# Patient Record
Sex: Male | Born: 1970 | Hispanic: No | Marital: Single | Smoking: Never smoker
Health system: Southern US, Community
[De-identification: ages and names within clinical notes are randomized; demographics above are authoritative.]

## PROBLEM LIST (undated history)

## (undated) DIAGNOSIS — E119 Type 2 diabetes mellitus without complications: Secondary | ICD-10-CM

## (undated) DIAGNOSIS — I10 Essential (primary) hypertension: Secondary | ICD-10-CM

## (undated) DIAGNOSIS — I429 Cardiomyopathy, unspecified: Secondary | ICD-10-CM

## (undated) HISTORY — PX: NO PAST SURGERIES: SHX2092

---

## 2018-09-01 ENCOUNTER — Encounter: Payer: Self-pay | Admitting: Emergency Medicine

## 2018-09-01 ENCOUNTER — Inpatient Hospital Stay: Payer: Self-pay

## 2018-09-01 ENCOUNTER — Emergency Department: Payer: Self-pay

## 2018-09-01 ENCOUNTER — Other Ambulatory Visit: Payer: Self-pay

## 2018-09-01 ENCOUNTER — Inpatient Hospital Stay
Admission: EM | Admit: 2018-09-01 | Discharge: 2018-09-02 | DRG: 305 | Disposition: A | Discharge status: Home / Self Care | Payer: Self-pay | Attending: Internal Medicine | Admitting: Internal Medicine

## 2018-09-01 DIAGNOSIS — R7989 Other specified abnormal findings of blood chemistry: Secondary | ICD-10-CM

## 2018-09-01 DIAGNOSIS — Z6838 Body mass index (BMI) 38.0-38.9, adult: Secondary | ICD-10-CM

## 2018-09-01 DIAGNOSIS — I5023 Acute on chronic systolic (congestive) heart failure: Secondary | ICD-10-CM | POA: Insufficient documentation

## 2018-09-01 DIAGNOSIS — Z8249 Family history of ischemic heart disease and other diseases of the circulatory system: Secondary | ICD-10-CM

## 2018-09-01 DIAGNOSIS — E785 Hyperlipidemia, unspecified: Secondary | ICD-10-CM | POA: Diagnosis present

## 2018-09-01 DIAGNOSIS — F1410 Cocaine abuse, uncomplicated: Secondary | ICD-10-CM | POA: Diagnosis present

## 2018-09-01 DIAGNOSIS — I43 Cardiomyopathy in diseases classified elsewhere: Secondary | ICD-10-CM | POA: Diagnosis present

## 2018-09-01 DIAGNOSIS — I119 Hypertensive heart disease without heart failure: Secondary | ICD-10-CM | POA: Diagnosis present

## 2018-09-01 DIAGNOSIS — R7301 Impaired fasting glucose: Secondary | ICD-10-CM | POA: Diagnosis present

## 2018-09-01 DIAGNOSIS — Z9114 Patient's other noncompliance with medication regimen: Secondary | ICD-10-CM

## 2018-09-01 DIAGNOSIS — I248 Other forms of acute ischemic heart disease: Secondary | ICD-10-CM | POA: Diagnosis present

## 2018-09-01 DIAGNOSIS — E669 Obesity, unspecified: Secondary | ICD-10-CM | POA: Diagnosis present

## 2018-09-01 DIAGNOSIS — E876 Hypokalemia: Secondary | ICD-10-CM | POA: Diagnosis present

## 2018-09-01 DIAGNOSIS — I160 Hypertensive urgency: Principal | ICD-10-CM | POA: Diagnosis present

## 2018-09-01 HISTORY — DX: Type 2 diabetes mellitus without complications: E11.9

## 2018-09-01 HISTORY — DX: Cardiomyopathy, unspecified: I42.9

## 2018-09-01 HISTORY — DX: Essential (primary) hypertension: I10

## 2018-09-01 LAB — URINALYSIS, COMPLETE (UACMP) WITH MICROSCOPIC
BACTERIA UA: NONE SEEN
Bilirubin Urine: NEGATIVE
Glucose, UA: NEGATIVE mg/dL
Ketones, ur: NEGATIVE mg/dL
Leukocytes, UA: NEGATIVE
Nitrite: NEGATIVE
Protein, ur: NEGATIVE mg/dL
Specific Gravity, Urine: 1.008 (ref 1.005–1.030)
Squamous Epithelial / HPF: NONE SEEN (ref 0–5)
pH: 6 (ref 5.0–8.0)

## 2018-09-01 LAB — URINE DRUG SCREEN, QUALITATIVE (ARMC ONLY)
AMPHETAMINES, UR SCREEN: NOT DETECTED
Barbiturates, Ur Screen: NOT DETECTED
Benzodiazepine, Ur Scrn: NOT DETECTED
COCAINE METABOLITE, UR ~~LOC~~: POSITIVE — AB
Cannabinoid 50 Ng, Ur ~~LOC~~: NOT DETECTED
MDMA (ECSTASY) UR SCREEN: NOT DETECTED
Methadone Scn, Ur: NOT DETECTED
Opiate, Ur Screen: NOT DETECTED
Phencyclidine (PCP) Ur S: NOT DETECTED
Tricyclic, Ur Screen: NOT DETECTED

## 2018-09-01 LAB — HEPATIC FUNCTION PANEL
ALT: 22 U/L (ref 0–44)
AST: 27 U/L (ref 15–41)
Albumin: 3.7 g/dL (ref 3.5–5.0)
Alkaline Phosphatase: 82 U/L (ref 38–126)
BILIRUBIN INDIRECT: 0.9 mg/dL (ref 0.3–0.9)
Bilirubin, Direct: 0.1 mg/dL (ref 0.0–0.2)
Total Bilirubin: 1 mg/dL (ref 0.3–1.2)
Total Protein: 7.7 g/dL (ref 6.5–8.1)

## 2018-09-01 LAB — TROPONIN I
Troponin I: 0.05 ng/mL (ref <0.03)
Troponin I: 0.05 ng/mL (ref <0.03)
Troponin I: 0.05 ng/mL (ref <0.03)

## 2018-09-01 LAB — CBC
HCT: 44.4 % (ref 39.0–52.0)
Hemoglobin: 14.3 g/dL (ref 13.0–17.0)
MCH: 27.9 pg (ref 26.0–34.0)
MCHC: 32.2 g/dL (ref 30.0–36.0)
MCV: 86.5 fL (ref 80.0–100.0)
PLATELETS: 363 10*3/uL (ref 150–400)
RBC: 5.13 MIL/uL (ref 4.22–5.81)
RDW: 13.1 % (ref 11.5–15.5)
WBC: 9.4 10*3/uL (ref 4.0–10.5)
nRBC: 0 % (ref 0.0–0.2)

## 2018-09-01 LAB — BASIC METABOLIC PANEL
Anion gap: 6 (ref 5–15)
BUN: 16 mg/dL (ref 6–20)
CO2: 27 mmol/L (ref 22–32)
Calcium: 8.5 mg/dL — ABNORMAL LOW (ref 8.9–10.3)
Chloride: 104 mmol/L (ref 98–111)
Creatinine, Ser: 1.11 mg/dL (ref 0.61–1.24)
GFR calc Af Amer: >60 mL/min (ref >60)
Glucose, Bld: 155 mg/dL — ABNORMAL HIGH (ref 70–99)
Potassium: 3.8 mmol/L (ref 3.5–5.1)
Sodium: 137 mmol/L (ref 135–145)

## 2018-09-01 LAB — MAGNESIUM: Magnesium: 2 mg/dL (ref 1.7–2.4)

## 2018-09-01 LAB — BRAIN NATRIURETIC PEPTIDE: B Natriuretic Peptide: 60 pg/mL (ref 0.0–100.0)

## 2018-09-01 MED ORDER — ONDANSETRON HCL 4 MG/2ML IJ SOLN: 4.0000 mg | Freq: Four times a day (QID) | INTRAMUSCULAR | Status: DC | PRN

## 2018-09-01 MED ORDER — ACETAMINOPHEN 650 MG RE SUPP: 650.0000 mg | Freq: Four times a day (QID) | RECTAL | Status: DC | PRN

## 2018-09-01 MED ORDER — ASPIRIN 81 MG PO CHEW
324.0000 mg | CHEWABLE_TABLET | Freq: Once | ORAL | Status: AC
  Administered 2018-09-01: 324 mg via ORAL
  Filled 2018-09-01: qty 4

## 2018-09-01 MED ORDER — ASPIRIN EC 81 MG PO TBEC
81.0000 mg | DELAYED_RELEASE_TABLET | Freq: Every day | ORAL | Status: DC
  Administered 2018-09-02: 81 mg via ORAL
  Filled 2018-09-01: qty 1

## 2018-09-01 MED ORDER — FUROSEMIDE 20 MG PO TABS
20.0000 mg | ORAL_TABLET | Freq: Every day | ORAL | Status: DC
  Administered 2018-09-02: 20 mg via ORAL
  Filled 2018-09-01: qty 1

## 2018-09-01 MED ORDER — INFLUENZA VAC SPLIT QUAD 0.5 ML IM SUSY: 0.5000 mL | PREFILLED_SYRINGE | INTRAMUSCULAR | Status: DC

## 2018-09-01 MED ORDER — CLONIDINE HCL 0.1 MG PO TABS
0.2000 mg | ORAL_TABLET | Freq: Once | ORAL | Status: AC
  Administered 2018-09-01: 0.2 mg via ORAL
  Filled 2018-09-01: qty 2

## 2018-09-01 MED ORDER — SODIUM CHLORIDE 0.9% FLUSH
3.0000 mL | Freq: Once | INTRAVENOUS | Status: AC
  Administered 2018-09-01: 3 mL via INTRAVENOUS

## 2018-09-01 MED ORDER — ONDANSETRON HCL 4 MG PO TABS: 4.0000 mg | ORAL_TABLET | Freq: Four times a day (QID) | ORAL | Status: DC | PRN

## 2018-09-01 MED ORDER — ENOXAPARIN SODIUM 40 MG/0.4ML ~~LOC~~ SOLN
40.0000 mg | SUBCUTANEOUS | Status: DC
  Administered 2018-09-01: 40 mg via SUBCUTANEOUS
  Filled 2018-09-01: qty 0.4

## 2018-09-01 MED ORDER — IOHEXOL 350 MG/ML SOLN
75.0000 mL | Freq: Once | INTRAVENOUS | Status: AC | PRN
  Administered 2018-09-01: 75 mL via INTRAVENOUS

## 2018-09-01 MED ORDER — HYDRALAZINE HCL 20 MG/ML IJ SOLN
5.0000 mg | INTRAMUSCULAR | Status: DC | PRN
  Administered 2018-09-02: 5 mg via INTRAVENOUS
  Filled 2018-09-01: qty 1

## 2018-09-01 MED ORDER — LISINOPRIL 20 MG PO TABS
20.0000 mg | ORAL_TABLET | Freq: Every day | ORAL | Status: DC
  Administered 2018-09-01 – 2018-09-02 (×2): 20 mg via ORAL
  Filled 2018-09-01: qty 2
  Filled 2018-09-01: qty 1

## 2018-09-01 MED ORDER — CARVEDILOL 6.25 MG PO TABS
6.2500 mg | ORAL_TABLET | Freq: Two times a day (BID) | ORAL | Status: DC
  Administered 2018-09-02: 6.25 mg via ORAL
  Filled 2018-09-01: qty 1

## 2018-09-01 MED ORDER — SPIRONOLACTONE 25 MG PO TABS
12.5000 mg | ORAL_TABLET | Freq: Every day | ORAL | Status: DC
  Administered 2018-09-01 – 2018-09-02 (×2): 12.5 mg via ORAL
  Filled 2018-09-01 (×2): qty 0.5
  Filled 2018-09-01: qty 1

## 2018-09-01 MED ORDER — FUROSEMIDE 10 MG/ML IJ SOLN
60.0000 mg | Freq: Once | INTRAMUSCULAR | Status: AC
  Administered 2018-09-01: 60 mg via INTRAVENOUS
  Filled 2018-09-01: qty 8

## 2018-09-01 MED ORDER — ACETAMINOPHEN 325 MG PO TABS: 650.0000 mg | ORAL_TABLET | Freq: Four times a day (QID) | ORAL | Status: DC | PRN

## 2018-09-01 MED ORDER — METOPROLOL SUCCINATE ER 50 MG PO TB24
25.0000 mg | ORAL_TABLET | Freq: Every day | ORAL | Status: DC
  Administered 2018-09-01: 25 mg via ORAL
  Filled 2018-09-01: qty 1

## 2018-09-01 NOTE — ED Notes (Signed)
Pt given meal tray.

## 2018-09-01 NOTE — ED Notes (Signed)
Report given to Cole RN 

## 2018-09-01 NOTE — ED Triage Notes (Addendum)
C/O dizziness x 1 day.   States symptoms started yesterday evening at around 1730.  Also C/O chest pain and blurry vision.  Also c/o cough x 2-3 days.  Patient states he has a history of HTN and has been off medication for about 2 months.  States he does not known the names of his medications.

## 2018-09-01 NOTE — H&P (Signed)
[facility] [place] [facility] [place]   PATIENT NAME: Tom Holmes    MR#:  [id number]  DATE OF BIRTH:  [birth year]  DATE OF ADMISSION:  09/01/2018  PRIMARY CARE PHYSICIAN: Patient, No Pcp Per   REQUESTING/REFERRING PHYSICIAN: Dr Willy Eddy  CHIEF COMPLAINT:  No chief complaint on file.   HISTORY OF PRESENT ILLNESS:  Tom Holmes  is a 48 y.o. male with a known history of hypertension.  He has not been on medications in 2 years.  He does not have a medical doctor.  He was told in the past that he needed a defibrillator and was prescribed a LifeVest but could not afford the LifeVest.  He has not followed up since then.  He states that he developed blurry vision and dizziness starting yesterday evening.  He has a weird feeling in his chest.  Has constant pressure there 6 out of 10 intensity in the center of his chest.  Did have some nausea vomiting last night.  Sitting up helps.  Did have some sweating this morning.  PAST MEDICAL HISTORY:   Past Medical History:  Diagnosis Date  . Cardiomyopathy (HCC)   . Hypertension     PAST SURGICAL HISTORY:   Past Surgical History:  Procedure Laterality Date  . NO PAST SURGERIES      SOCIAL HISTORY:   Social History   Tobacco Use  . Smoking status: Never Smoker  . Smokeless tobacco: Never Used  Substance Use Topics  . Alcohol use: Yes    Comment: socially    FAMILY HISTORY:   Family History  Problem Relation Age of Onset  . Hypertension Mother     DRUG ALLERGIES:  No Known Allergies  REVIEW OF SYSTEMS:  CONSTITUTIONAL: No fever, fatigue or weakness.  Positive for sweating this morning. EYES: Positive for blurry vision. EARS, NOSE, AND THROAT: No tinnitus or ear pain. No sore throat RESPIRATORY: Some cough, shortness of breath, wheezing.  No hemoptysis.  CARDIOVASCULAR: Positive for chest pain, no orthopnea, edema.  GASTROINTESTINAL: Last night with nausea, vomiting.  No diarrhea or  abdominal pain. No blood in bowel movements.  Some abdominal bloating over the last 2 months. GENITOURINARY: No dysuria, hematuria.  ENDOCRINE: No polyuria, nocturia,  HEMATOLOGY: No anemia, easy bruising or bleeding SKIN: No rash or lesion. MUSCULOSKELETAL: No joint pain or arthritis.   NEUROLOGIC: No tingling, numbness, weakness.  PSYCHIATRY: No anxiety or depression.   MEDICATIONS AT HOME:   Prior to Admission medications   Not on File   Patient takes no medications.  VITAL SIGNS:  Blood pressure (!) 188/124, pulse 84, temperature 98.4 F (36.9 C), temperature source Oral, resp. rate 18, height 5\' 6"  (1.676 m), weight 108.9 kg, SpO2 97 %.  PHYSICAL EXAMINATION:  GENERAL:  48 y.o.-year-old patient lying in the bed with no acute distress.  EYES: Pupils equal, round, reactive to light and accommodation. No scleral icterus. Extraocular muscles intact.  HEENT: Head atraumatic, normocephalic. Oropharynx and nasopharynx clear.  NECK:  Supple, no jugular venous distention. No thyroid enlargement, no tenderness.  LUNGS: Normal breath sounds bilaterally, no wheezing, rales,rhonchi or crepitation. No use of accessory muscles of respiration.  CARDIOVASCULAR: S1, S2 normal. No murmurs, rubs, or gallops.  ABDOMEN: Soft, nontender, nondistended. Bowel sounds present. No organomegaly or mass.  EXTREMITIES: No pedal edema, cyanosis, or clubbing.  NEUROLOGIC: Cranial nerves II through XII are intact. Muscle strength 5/5 in all extremities. Sensation intact. Gait not checked.  PSYCHIATRIC: The patient is alert and  oriented x 3.  SKIN: No rash, lesion, or ulcer.   LABORATORY PANEL:   CBC Recent Labs  Lab 09/01/18 0733  WBC 9.4  HGB 14.3  HCT 44.4  PLT 363   ------------------------------------------------------------------------------------------------------------------  Chemistries  Recent Labs  Lab 09/01/18 0733  NA 137  K 3.8  CL 104  CO2 27  GLUCOSE 155*  BUN 16  CREATININE  1.11  CALCIUM 8.5*  AST 27  ALT 22  ALKPHOS 82  BILITOT 1.0   ------------------------------------------------------------------------------------------------------------------  Cardiac Enzymes Recent Labs  Lab 09/01/18 0733  TROPONINI 0.05*   ------------------------------------------------------------------------------------------------------------------  RADIOLOGY:  Dg Chest 2 View  Result Date: 09/01/2018 CLINICAL DATA:  dizziness x 1 day. States symptoms started yesterday evening at around 1730. Also C/O chest pain and blurry vision. Also c/o cough x 2-3 days. Patient states he has a history of HTN and has been off medication for about 2 months. Hx - HTN, non-smoker. EXAM: CHEST - 2 VIEW COMPARISON:  none FINDINGS: Lungs are clear. Heart size and mediastinal contours are within normal limits. No effusion. Visualized bones unremarkable. IMPRESSION: No acute cardiopulmonary disease. Electronically Signed   By: Corlis Leak M.D.   On: 09/01/2018 08:14    EKG:   Normal sinus rhythm 94 bpm, flipped T wave in V6 and lead I.  Left atrial enlargement.  IMPRESSION AND PLAN:   1.  Hypertensive urgency.  The patient has been off hypertensive medications for the past 2 years so his blood pressure likely has been high for a long period of time.  I would not like to lower his blood pressure too low too quickly.  Since the patient does have a history of cardiomyopathy I will start on low-dose lisinopril spironolactone and Toprol and PRN IV hydralazine. 2.  Chest pain and borderline troponin, history of cardiomyopathy CT Angio of the chest to rule out dissection.  Cardiology consultation.  Serial troponins to rule out heart disease.  Started on aspirin and Toprol.  Echocardiogram with his history of cardiomyopathy.  Troponin may be demand ischemia from hypertensive urgency. 3.  Impaired fasting glucose send off a hemoglobin A1c 4.  Obesity with a BMI of 38.74.   All the records are reviewed and  case discussed with ED provider. Management plans discussed with the patient, family and they are in agreement.  CODE STATUS: Full code  TOTAL TIME TAKING CARE OF THIS PATIENT: 55 minutes.    Alford Highland M.D on 09/01/2018 at 9:33 AM  Between 7am to 6pm - Pager - [phone]  After 6pm call admission pager [phone]  [facility]  [phone]  CC: Primary care physician; Patient, No Pcp Per

## 2018-09-01 NOTE — ED Provider Notes (Signed)
[facility] Emergency Department Provider Note    First MD Initiated Contact with Patient 09/01/18 [phone]     (approximate)  I have reviewed the triage vital signs and the nursing notes.   HISTORY  Chief Complaint SOB, blurry vision   HPI Tom Holmes is a 48 y.o. male a history of hypertension as well as congestive heart failure recently moving up to [facility] from [facility] presents to the ER for evaluation of shortness of breath, orthopnea as well as blurred vision that started yesterday.  States his orthopnea and shortness of breath is been worsening over the past several days.  Has not been on his home antihypertensive medications or blood pressure medications for over 2 months.  Denies any chest pain.  States he has had similar episodes in the past when his blood pressure was elevated.  Denies any recent fevers or cough.  Is having worsening exertional dyspnea and has noticed worsening swelling in his legs.    Echo 2018:   Summary  The left ventricular systolic function is mildly decreased with estimated  LV ejection fraction of 45-50%  Past Medical History:  Diagnosis Date  . Cardiomyopathy (HCC)   . Diabetes mellitus without complication (HCC)   . Hypertension    Family History  Problem Relation Age of Onset  . Hypertension Mother    Past Surgical History:  Procedure Laterality Date  . NO PAST SURGERIES     Patient Active Problem List   Diagnosis Date Noted  . Hypertensive urgency 09/01/2018      Prior to Admission medications   Not on File    Allergies Patient has no known allergies.    Social History Social History   Tobacco Use  . Smoking status: Never Smoker  . Smokeless tobacco: Never Used  Substance Use Topics  . Alcohol use: Yes    Comment: socially  . Drug use: Never    Review of Systems Patient denies headaches, rhinorrhea, blurry vision, numbness, shortness of breath, chest pain, edema, cough,  abdominal pain, nausea, vomiting, diarrhea, dysuria, fevers, rashes or hallucinations unless otherwise stated above in HPI. ____________________________________________   PHYSICAL EXAM:  VITAL SIGNS: Vitals:   09/01/18 1013 09/01/18 1039  BP: (!) 166/112 (!) 156/101  Pulse: 75 75  Resp: 18 18  Temp:    SpO2: 97% 97%    Constitutional: Alert and oriented.  Eyes: Conjunctivae are normal.  Head: Atraumatic. Nose: No congestion/rhinnorhea. Mouth/Throat: Mucous membranes are moist.   Neck: No stridor. Painless ROM.  Cardiovascular: Normal rate, regular rhythm. Grossly normal heart sounds.  Good peripheral circulation. Respiratory: Normal respiratory effort.  No retractions. Lungs with bibasilar crackles Gastrointestinal: Soft and nontender. No distention. No abdominal bruits. No CVA tenderness. Genitourinary:  Musculoskeletal: No lower extremity tenderness nor edema.  No joint effusions. Neurologic:  Normal speech and language. No gross focal neurologic deficits are appreciated. No facial droop Skin:  Skin is warm, dry and intact. No rash noted. Psychiatric: Mood and affect are normal. Speech and behavior are normal.  ____________________________________________   LABS (all labs ordered are listed, but only abnormal results are displayed)  Results for orders placed or performed during the hospital encounter of 09/01/18 (from the past 24 hour(s))  Basic metabolic panel     Status: Abnormal   Collection Time: 09/01/18  7:33 AM  Result Value Ref Range   Sodium 137 135 - 145 mmol/L   Potassium 3.8 3.5 - 5.1 mmol/L   Chloride 104 98 -  111 mmol/L   CO2 27 22 - 32 mmol/L   Glucose, Bld 155 (H) 70 - 99 mg/dL   BUN 16 6 - 20 mg/dL   Creatinine, Ser 9.601.11 0.61 - 1.24 mg/dL   Calcium 8.5 (L) 8.9 - 10.3 mg/dL   GFR calc non Af Amer >60 >60 mL/min   GFR calc Af Amer >60 >60 mL/min   Anion gap 6 5 - 15  CBC     Status: None   Collection Time: 09/01/18  7:33 AM  Result Value Ref Range    WBC 9.4 4.0 - 10.5 K/uL   RBC 5.13 4.22 - 5.81 MIL/uL   Hemoglobin 14.3 13.0 - 17.0 g/dL   HCT 45.444.4 09.839.0 - 11.952.0 %   MCV 86.5 80.0 - 100.0 fL   MCH 27.9 26.0 - 34.0 pg   MCHC 32.2 30.0 - 36.0 g/dL   RDW 14.713.1 82.911.5 - 56.215.5 %   Platelets 363 150 - 400 K/uL   nRBC 0.0 0.0 - 0.2 %  Troponin I - ONCE - STAT     Status: Abnormal   Collection Time: 09/01/18  7:33 AM  Result Value Ref Range   Troponin I 0.05 (HH) <0.03 ng/mL  Hepatic function panel     Status: None   Collection Time: 09/01/18  7:33 AM  Result Value Ref Range   Total Protein 7.7 6.5 - 8.1 g/dL   Albumin 3.7 3.5 - 5.0 g/dL   AST 27 15 - 41 U/L   ALT 22 0 - 44 U/L   Alkaline Phosphatase 82 38 - 126 U/L   Total Bilirubin 1.0 0.3 - 1.2 mg/dL   Bilirubin, Direct 0.1 0.0 - 0.2 mg/dL   Indirect Bilirubin 0.9 0.3 - 0.9 mg/dL  Brain natriuretic peptide     Status: None   Collection Time: 09/01/18  7:33 AM  Result Value Ref Range   B Natriuretic Peptide 60.0 0.0 - 100.0 pg/mL  Urinalysis, Complete w Microscopic     Status: Abnormal   Collection Time: 09/01/18 10:15 AM  Result Value Ref Range   Color, Urine STRAW (A) YELLOW   APPearance CLEAR (A) CLEAR   Specific Gravity, Urine 1.008 1.005 - 1.030   pH 6.0 5.0 - 8.0   Glucose, UA NEGATIVE NEGATIVE mg/dL   Hgb urine dipstick SMALL (A) NEGATIVE   Bilirubin Urine NEGATIVE NEGATIVE   Ketones, ur NEGATIVE NEGATIVE mg/dL   Protein, ur NEGATIVE NEGATIVE mg/dL   Nitrite NEGATIVE NEGATIVE   Leukocytes, UA NEGATIVE NEGATIVE   RBC / HPF 0-5 0 - 5 RBC/hpf   WBC, UA 0-5 0 - 5 WBC/hpf   Bacteria, UA NONE SEEN NONE SEEN   Squamous Epithelial / LPF NONE SEEN 0 - 5   Mucus PRESENT    ____________________________________________  EKG My review and personal interpretation at Time: 7:42   Indication: htn  Rate: 95  Rhythm: sinus Axis: normal Other: anterior t wave inversion, no stemi criteria ____________________________________________  RADIOLOGY  I personally reviewed all  radiographic images ordered to evaluate for the above acute complaints and reviewed radiology reports and findings.  These findings were personally discussed with the patient.  Please see medical record for radiology report.  ____________________________________________   PROCEDURES  Procedure(s) performed:  Procedures    Critical Care performed: no ____________________________________________   INITIAL IMPRESSION / ASSESSMENT AND PLAN / ED COURSE  Pertinent labs & imaging results that were available during my care of the patient were reviewed by me and considered  in my medical decision making (see chart for details).   DDX: Hypertensive urgency, congestive heart failure, volume overload, electrolyte abnormality, anemia, medication noncompliance, ACS  Daelon Viano is a 48 y.o. who presents to the ED with was as described above.  No focal neuro deficits but does describe blurry vision certainly worrisome for hypertensive urgency.  We will give clonidine.  Describing orthopnea and lower extremity swelling given his history of congestive heart failure will give Lasix.  The patient will be placed on continuous pulse oximetry and telemetry for monitoring.  Laboratory evaluation will be sent to evaluate for the above complaints.     Clinical Course as of Sep 01 1044  Mon Sep 01, 2018  [id number] Patient's troponin is elevated.  In the setting of his significant hypertension particular is elevated diastolic pressures with symptomatic hypertension with no PCP I do believe the patient would benefit from medical management in the hospital, possible cardiology consultation.  He is given aspirin.  He denies any pain right now.  Blood pressure is coming down with clonidine as well as Lasix.  Not floridly volume overloaded to suggest pulmonary edema but was previously on Lasix and given the lower extremity swelling and orthopnea I think he would benefit from this.  Will consult with hospitalist.   [PR]      Clinical Course User Index [PR] Willy Eddy, MD     As part of my medical decision making, I reviewed the following data within the electronic MEDICAL RECORD NUMBER Nursing notes reviewed and incorporated, Labs reviewed, notes from prior ED visits and [place] Controlled Substance Database   ____________________________________________   FINAL CLINICAL IMPRESSION(S) / ED DIAGNOSES  Final diagnoses:  Hypertensive urgency      NEW MEDICATIONS STARTED DURING THIS VISIT:  New Prescriptions   No medications on file     Note:  This document was prepared using Dragon voice recognition software and may include unintentional dictation errors.    Willy Eddy, MD 09/01/18 1046

## 2018-09-01 NOTE — Consult Note (Signed)
Cardiology Consultation:   Patient ID: Tom Rammingony Jicha MRN: [id number]; DOB: 1970/11/10  Admit date: 09/01/2018 Date of Consult: 09/01/2018  Primary Care Provider: Patient, No Pcp Per Primary Cardiologist: [facility], Dr. Kirke CorinArida  Primary Electrophysiologist:  None    Patient Profile:   Tom Holmes is a 48 y.o. male with a hx of HTN, HFrEF (EF 20-25%  45-50%), diabetes mellitus with neuropathy, hypertension, obesity, CKD 3, substance abuse with past history of cocaine and alcohol abuse, and medication non-compliance who is being seen today for the evaluation of hypertensive urgency and elevated troponin at the request of Dr. Renae GlossWieting.  History of Present Illness:   Mr. Trudee GripOxendine is a 48 yo male with PMH as above.  [facility] was [facility] 2017 for hypertensive urgency and acute on chronic HFrEF per review of care everywhere. 2017 echo showed EF 20 to 25% with mild to moderate concentric hypertrophy with severe diffuse hypokinesis with mild mitral regurgitation and right ventricular systolic pressure estimated at 49 mmHg.  He was reportedly advised to get a LifeVest as he could not afford cardiac catheterization.  He was started on medical management for HFrEF; however, it was reported that he was not taking medication, because he felt weak with the medications.  Patient was seen at [facility] in August 2018 when admitted for worsening shortness of breath.  At that time, patient reported his last cocaine abuse was the previous week.  He reported progressive shortness of breath over the last 24 hours, recent cocaine use, and a diet high in salt.  Specifically, he had salty chips prior to admission.  He also complained of orthopnea.  He denied chest pain, lower extremity edema, abdominal pain, leg pain, or recent travel.  At presentation, he was unable to speak in full sentences due to his shortness of breath.  He was given a dose of Lasix in the ED with alleviation  of his symptoms.  He was also restarted on a beta-blocker, ACE inhibitor, and diuretics with subsequent improvement in his blood pressure.  Prazosin was added on the third day for further blood pressure management.  Repeat 2018 echocardiogram revealed improvement in his ejection fraction to EF 40 to 45%.  He was discharged home 03/28/2017 and prescription sent electronically to his pharmacy.  Since that time, the patient reports he has experienced periodic shortness of breath.  He has not taken his medications for the past two years, as he was attempting to control his blood pressure through diet and exercise. When questioned further about stopping his medications, he also reported financial difficulties and difficulty with insurance.  He denied having a past or current cardiologist. He does not have a regular PCP. He denied an exercise routine or any dietary changes.  He eats once daily, and this meal typically includes macaroni and cheese, spaghetti, and other prepackaged foods. When asked if he checked his blood pressure at home, he reported that he does, but that he usually runs "45/45." He reported chronic orthopnea, stating he usually needed 1 pillow and another folded in half to sleep at night. He also reported progressive shortness of breath with even mild activity, such as walking a block or going upstairs. He typically drank 1-2 glasses of tea and 1-2 glasses of water daily. He denied a history of smoking but did report a history of both drug and alcohol use with tox screen pending. He denied a history of LEE but did report abdominal swelling.  The day  before his admission, he was on his way back from being out of town.  He was "just sitting," when he started to feel short of breath / DOE and dizzy with associated chest tightness, diaphoresis, blurry vision, constipation, nausea, and emesis x3. He did not look at this emesis to describe it further. He reported 6/10 chest tightness "like a band across  his chest," which he stated was still bothering him at the time of the interview. He has had pain like this in the past with elevated BP. He also reported current abdominal swelling. He stated his vision had improved but was still somewhat blurry.  He denied headache, palpitations, or feeling of racing HR. He reported symptoms of pre-syncope, made worse when attempting to stand. He reported all of these symptoms started yesterday and lasted throughout the night, interfering with his sleep.  No further episodes of emesis were reported. Today, he decided to report to the emergency room.    In the ED, he was hypertensive with BP 166/112, HR 75, 97% ORA. He received clonidine and lasix, given there was a concern for hypertensive urgency and he was thought to be volume overloaded. EKG showed SR, 94bpm, IVCD, LVH, probable LAE, TWI lateral leads.  CT showed no evidence of aneurysm or dissection, no acute cardiopulmonary disease, and mild hepatic steatosis. Troponin mildly elevated, flat trending at 0.05  0.05. Cardiology consulted for further evaluation.   Past Medical History:  Diagnosis Date  . Cardiomyopathy (HCC)   . Diabetes mellitus without complication (HCC)   . Hypertension     Past Surgical History:  Procedure Laterality Date  . NO PAST SURGERIES       Home Medications:  Prior to Admission medications   Not on File    Inpatient Medications: Scheduled Meds: . [START ON 09/02/2018] aspirin EC  81 mg Oral Daily  . enoxaparin (LOVENOX) injection  40 mg Subcutaneous Q24H  . [START ON 09/02/2018] furosemide  20 mg Oral Daily  . lisinopril  20 mg Oral Daily  . metoprolol succinate  25 mg Oral Daily  . spironolactone  12.5 mg Oral Daily   Continuous Infusions:  PRN Meds: acetaminophen **OR** acetaminophen, hydrALAZINE, ondansetron **OR** ondansetron (ZOFRAN) IV  Allergies:   No Known Allergies  Social History:   Social History   Socioeconomic History  . Marital status: Single     Spouse name: Not on file  . Number of children: Not on file  . Years of education: Not on file  . Highest education level: Not on file  Occupational History  . Not on file  Social Needs  . Financial resource strain: Not on file  . Food insecurity:    Worry: Not on file    Inability: Not on file  . Transportation needs:    Medical: Not on file    Non-medical: Not on file  Tobacco Use  . Smoking status: Never Smoker  . Smokeless tobacco: Never Used  Substance and Sexual Activity  . Alcohol use: Yes    Comment: socially  . Drug use: Never  . Sexual activity: Not on file  Lifestyle  . Physical activity:    Days per week: Not on file    Minutes per session: Not on file  . Stress: Not on file  Relationships  . Social connections:    Talks on phone: Not on file    Gets together: Not on file    Attends religious service: Not on file    Active member  of club or organization: Not on file    Attends meetings of clubs or organizations: Not on file    Relationship status: Not on file  . Intimate partner violence:    Fear of current or ex partner: Not on file    Emotionally abused: Not on file    Physically abused: Not on file    Forced sexual activity: Not on file  Other Topics Concern  . Not on file  Social History Narrative  . Not on file    Family History:    Family History  Problem Relation Age of Onset  . Hypertension Mother      ROS:  Please see the history of present illness.  Review of Systems  Constitutional: Positive for diaphoresis. Negative for weight loss.  Eyes: Positive for blurred vision.  Respiratory: Positive for shortness of breath. Negative for cough and hemoptysis.   Cardiovascular: Positive for chest pain and orthopnea. Negative for palpitations, claudication, leg swelling and PND.       Abdominal swelling  Gastrointestinal: Positive for constipation, nausea and vomiting. Negative for diarrhea.  Genitourinary: Negative for dysuria, frequency,  hematuria and urgency.  Musculoskeletal: Negative for back pain.  Neurological: Positive for dizziness. Negative for focal weakness, loss of consciousness and headaches.  All other systems reviewed and are negative.   All other ROS reviewed and negative.     Physical Exam/Data:   Vitals:   09/01/18 1039 09/01/18 1100 09/01/18 1205 09/01/18 1206  BP: (!) 156/101 (!) 148/110    Pulse: 75 84 90 100  Resp: 18     Temp:      TempSrc:      SpO2: 97% 97% 98% 98%  Weight:      Height:        Intake/Output Summary (Last 24 hours) at 09/01/2018 1251 Last data filed at 09/01/2018 1000 Gross per 24 hour  Intake 240 ml  Output 800 ml  Net -560 ml   Filed Weights   09/01/18 0729  Weight: 108.9 kg   Body mass index is 38.74 kg/m.  General:  Obese, in no acute distress other than light sensitivity HEENT: normal Neck: no JVD Vascular: No carotid bruits; radial pulses 2+ right, 2+ left  Cardiac:  normal S1, S2; RRR; no murmur Lungs:  clear to auscultation bilaterally, no wheezing, rhonchi or rales  Abd: firm, tight, nontender, no hepatomegaly  Ext: no bilateral lower extremity edema Musculoskeletal:  No deformities, BUE and BLE strength normal and equal Skin: warm and dry  Neuro:  no focal abnormalities noted, vision sensitivity Psych:  Normal affect   EKG:  The EKG was personally reviewed and demonstrates:  As in HPI. SR, 94bpm LVH, probably LAE, IVCD, TWI lateral leads Telemetry:  Telemetry was personally reviewed and demonstrates:  HR 70-90s  Relevant CV Studies: Pending updated echo  03/26/2017 TTE Findings   Left Ventricle  Left ventricle size is normal.  Mildly increased left ventricle wall thickness.  The left ventricular systolic function is mildly decreased with estimated  LV ejection fraction of 45-50%.  There is mild global hypokinesis of the LV wall.  Left Atrium  Mildly dilated left atrium.  No evidence of thrombus within left atrium.  No evidence of atrial  septal defect.  No evidence of intracardiac shunting by color flow Doppler.  Right Atrium  Mildly dilated right atrium.  No evidence of thrombus or mass in the right atrium.  Right Ventricle  Mildly enlarged right ventricle cavity.  No evidence  of any mass in the right ventricle.  Mitral Valve  The mitral valve leaflets are mildly thickened.  Mild Mitral Annular Calcification  No evidence of mitral valve stenosis.  Trace mitral regurgitation is present.  Tricuspid Valve  The tricuspid valve is normal in structure and function.  No evidence of tricuspid stenosis.  No evidence of tricuspid regurgitation.  Aortic Valve  Aortic valve leaflets are somewhat thickened.  The aortic valve is trileaflet.  No hemodynamically significant valvular aortic stenosis.  No evidence of aortic valve regurgitation.  Pulmonic Valve  Thickened pulmonic valve leaflets with good excursion.  Trace pulmonic regurgitation present.  Miscellaneous  The aorta is within normal limits.  Aortic root dimension within normal limits.  Aortic root is somewhat fibrocalcified.  The Pulmonary artery is within normal limits.  The IVC is normal.  Pericardial Effusion  There is no pericardial effusion.  Laboratory Data:  Chemistry Recent Labs  Lab 09/01/18 0733  NA 137  K 3.8  CL 104  CO2 27  GLUCOSE 155*  BUN 16  CREATININE 1.11  CALCIUM 8.5*  GFRNONAA >60  GFRAA >60  ANIONGAP 6    Recent Labs  Lab 09/01/18 0733  PROT 7.7  ALBUMIN 3.7  AST 27  ALT 22  ALKPHOS 82  BILITOT 1.0   Hematology Recent Labs  Lab 09/01/18 0733  WBC 9.4  RBC 5.13  HGB 14.3  HCT 44.4  MCV 86.5  MCH 27.9  MCHC 32.2  RDW 13.1  PLT 363   Cardiac Enzymes Recent Labs  Lab 09/01/18 0733  TROPONINI 0.05*   No results for input(s): TROPIPOC in the last 168 hours.  BNP Recent Labs  Lab 09/01/18 0733  BNP 60.0    DDimer No results for input(s): DDIMER in the last 168 hours.  Radiology/Studies:  Dg Chest 2  View  Result Date: 09/01/2018 CLINICAL DATA:  dizziness x 1 day. States symptoms started yesterday evening at around 1730. Also C/O chest pain and blurry vision. Also c/o cough x 2-3 days. Patient states he has a history of HTN and has been off medication for about 2 months. Hx - HTN, non-smoker. EXAM: CHEST - 2 VIEW COMPARISON:  none FINDINGS: Lungs are clear. Heart size and mediastinal contours are within normal limits. No effusion. Visualized bones unremarkable. IMPRESSION: No acute cardiopulmonary disease. Electronically Signed   By: Corlis Leak M.D.   On: 09/01/2018 08:14   Ct Angio Chest/abd/pel For Dissection W And/or W/wo  Result Date: 09/01/2018 CLINICAL DATA:  Hypertension, blurred vision, dizziness, possible chest palpitations EXAM: CT ANGIOGRAPHY CHEST, ABDOMEN AND PELVIS TECHNIQUE: Multidetector CT imaging through the chest, abdomen and pelvis was performed using the standard protocol during bolus administration of intravenous contrast. Multiplanar reconstructed images and MIPs were obtained and reviewed to evaluate the vascular anatomy. CONTRAST:  75mL OMNIPAQUE IOHEXOL 350 MG/ML SOLN COMPARISON:  Chest radiographs dated 09/01/2018 FINDINGS: CTA CHEST FINDINGS Cardiovascular: On unenhanced CT, there is no evidence of intramural hematoma. No evidence of thoracic aortic aneurysm or dissection. Although not tailored for evaluation of the pulmonary arteries, there is no evidence of central pulmonary embolism. The heart is normal in size.  No pericardial effusion. Mild coronary atherosclerosis of the LAD. Mediastinum/Nodes: No suspicious mediastinal lymphadenopathy. Visualized thyroid is grossly unremarkable. Lungs/Pleura: Lungs are clear. No suspicious pulmonary nodules. No focal consolidation. No pleural effusion or pneumothorax. Musculoskeletal: Visualized osseous structures are within normal limits. Review of the MIP images confirms the above findings. CTA ABDOMEN AND PELVIS FINDINGS VASCULAR  Aorta:  No evidence of abdominal aortic aneurysm or dissection. Patent. Celiac: Patent. SMA: Patent. Renals: Patent bilaterally. IMA: Patent. Inflow: Patent. Veins: Grossly unremarkable. Review of the MIP images confirms the above findings. NON-VASCULAR Hepatobiliary: Mild hepatic steatosis. Gallbladder is unremarkable. No intrahepatic or extrahepatic dilatation. Pancreas: Within normal limits. Spleen: Within normal limits. Adrenals/Urinary Tract: Adrenal glands are within normal limits. Kidneys are within normal limits.  No hydronephrosis. Bladder is within normal limits. Stomach/Bowel: Stomach is within normal limits. No evidence of bowel obstruction. Normal appendix (series 6/image 150). Lymphatic: No suspicious abdominopelvic lymphadenopathy. Reproductive: Prostate is unremarkable. Other: No abdominopelvic ascites. Musculoskeletal: Mild degenerative changes of the lumbar spine, most prominent at L5-S1. Review of the MIP images confirms the above findings. IMPRESSION: No evidence of thoracoabdominal aortic aneurysm or dissection. No evidence of acute cardiopulmonary disease. Mild hepatic steatosis.  Otherwise unremarkable CT abdomen/pelvis. Electronically Signed   By: Charline Bills M.D.   On: 09/01/2018 10:02    Assessment and Plan:   Hypertensive Cardiomyopathy, history of LV dysfunction/HFrEF  - Abdominal fluid retention and SOB reported, slight distention noted on physical exam but otherwise does not appear terribly volume overloaded and BNP not elevated. No LEE on exam.  As in HPI, EF 20-25% 45-50%. Pending updated echo.    - Continue medical management with Toprol xL po 25mg , lisinopril po 20mg  daily, PRN hydralazine, lasix 20mg  po, and spironolactone. Titrate Toprol as needed for BP / rate control. Would not recommend further administration of clonidine given its tendency for rebound HTN. Carefully monitor fluid status and renal function with diuresis and discontinue if rise in Cr, especially given  patient does not appear terribly volume overloaded on exam. Daily BMET recommended. Cr 1.11 (actually improved from baseline Cr 1.4); K 3.8. Continue to monitor I/O, daily weights.  - Recommendation for aggressive risk factor modification with tight control of BP/home BP checks, lifestyle changes (diet, exercise), medication compliance, DM management with tight glucose control, and daily weights. Discussed establishing with PCP. Recommend case management assistance to ensure able to obtain medications. Further recommendations following echo.  Elevated troponin - Current chest tightness, rated 6/10. EKG without acute changes. Elevated troponin 0.05, flat trending and in the setting of elevated BP and rapid rate and consistent with supply demand ischemia but cannot completely rule out cardiac ischemia given risk factors below and previous echo with hypokinesis.  - Pending updated echo for further risk stratification as well as pending updated LDL and A1C to assist with further risk stratification. Previous A1C 02/2017 was 6.8. - Continue medical management as above. Careful attention to daily renal function and electrolytes (potassium) while on ACE and spironolactone. As above, pending LDL with recommendation for statin therapy if elevated. PRN nitro for continued pain. Daily BMET. - Would likely benefit from future ischemic workup given risk factors, including hypokinesis on previous echo. Consider future stress test evaluation as an outpatient (if can ensure follow-up); however, at this time, no plan for emergent / immediate invasive ischemic workup unless continued or worsening CP, acute EKG changes, and/or or further risk in troponin.    Hypokalemia, mild - Started on spironolactone. Check Mg. Daily BMET  History of substance abuse, including cocaine and alcohol - Urine drug screen pending  CKD3 - Monitor renal function with diuresis, daily BMET  DM2 - Recommend nutrition consult. Recommend  outpatient follow-up to manage A1C/glucose.  Medication noncompliance - Case management consultation recommended d/t financial constraints  For questions or updates, please contact [facility] Please consult [email or web address]  for contact info under     Signed, Lennon Alstrom, PA-C  09/01/2018 12:51 PM

## 2018-09-01 NOTE — ED Notes (Signed)
ED TO INPATIENT HANDOFF REPORT  Name/Age/Gender Tom Holmes 48 y.o. male  Code Status    Code Status Orders  (From admission, onward)         Start     Ordered   09/01/18 0931  Full code  Continuous     09/01/18 0930        Code Status History    This patient has a current code status but no historical code status.      Home/SNF/Other Home  Chief Complaint blurred vision dizzy  Level of Care/Admitting Diagnosis ED Disposition    ED Disposition Condition Comment   Admit  Hospital Area: [facility] [[id number]]  Level of Care: Telemetry [5]  Diagnosis: Hypertensive urgency [409811][[id number]]  Admitting Physician: Alford HighlandWIETING, RICHARD [[id number]]  Attending Physician: Alford HighlandWIETING, RICHARD [[id number]]  Estimated length of stay: past midnight tomorrow  Certification:: I certify this patient will need inpatient services for at least 2 midnights  PT Class (Do Not Modify): Inpatient [101]  PT Acc Code (Do Not Modify): Private [1]       Medical History Past Medical History:  Diagnosis Date  . Cardiomyopathy (HCC)   . Diabetes mellitus without complication (HCC)   . Hypertension     Allergies No Known Allergies  IV Location/Drains/Wounds Patient Lines/Drains/Airways Status   Active Line/Drains/Airways    Name:   Placement date:   Placement time:   Site:   Days:   Peripheral IV 09/01/18 Left Antecubital   09/01/18    0821    Antecubital   less than 1          Labs/Imaging Results for orders placed or performed during the hospital encounter of 09/01/18 (from the past 48 hour(s))  Basic metabolic panel     Status: Abnormal   Collection Time: 09/01/18  7:33 AM  Result Value Ref Range   Sodium 137 135 - 145 mmol/L   Potassium 3.8 3.5 - 5.1 mmol/L   Chloride 104 98 - 111 mmol/L   CO2 27 22 - 32 mmol/L   Glucose, Bld 155 (H) 70 - 99 mg/dL   BUN 16 6 - 20 mg/dL   Creatinine, Ser 0.861.11 0.61 - 1.24 mg/dL   Calcium 8.5 (L) 8.9 - 10.3 mg/dL   GFR calc non Af Amer  >60 >60 mL/min   GFR calc Af Amer >60 >60 mL/min   Anion gap 6 5 - 15    Comment: Performed at [facility], [address] [place], [place] [address]  CBC     Status: None   Collection Time: 09/01/18  7:33 AM  Result Value Ref Range   WBC 9.4 4.0 - 10.5 K/uL   RBC 5.13 4.22 - 5.81 MIL/uL   Hemoglobin 14.3 13.0 - 17.0 g/dL   HCT 96.244.4 95.239.0 - 84.152.0 %   MCV 86.5 80.0 - 100.0 fL   MCH 27.9 26.0 - 34.0 pg   MCHC 32.2 30.0 - 36.0 g/dL   RDW 32.413.1 40.111.5 - 02.715.5 %   Platelets 363 150 - 400 K/uL   nRBC 0.0 0.0 - 0.2 %    Comment: Performed at [facility], [address] [place], [place] [address]  Troponin I - ONCE - STAT     Status: Abnormal   Collection Time: 09/01/18  7:33 AM  Result Value Ref Range   Troponin I 0.05 (HH) <0.03 ng/mL    Comment: CRITICAL RESULT CALLED TO, READ BACK BY AND VERIFIED WITH BILL SMITH AT [phone]  09/01/2018 DAS Performed at [facility], [address] [place], [place] [address]   Hepatic function panel     Status: None   Collection Time: 09/01/18  7:33 AM  Result Value Ref Range   Total Protein 7.7 6.5 - 8.1 g/dL   Albumin 3.7 3.5 - 5.0 g/dL   AST 27 15 - 41 U/L   ALT 22 0 - 44 U/L   Alkaline Phosphatase 82 38 - 126 U/L   Total Bilirubin 1.0 0.3 - 1.2 mg/dL   Bilirubin, Direct 0.1 0.0 - 0.2 mg/dL   Indirect Bilirubin 0.9 0.3 - 0.9 mg/dL    Comment: Performed at [facility], [address] [place], [place] [address]  Brain natriuretic peptide     Status: None   Collection Time: 09/01/18  7:33 AM  Result Value Ref Range   B Natriuretic Peptide 60.0 0.0 - 100.0 pg/mL    Comment: Performed at [facility], [address] [place], [place] [address]  Urinalysis, Complete w Microscopic     Status: Abnormal   Collection Time: 09/01/18 10:15 AM  Result Value Ref Range   Color, Urine STRAW (A) YELLOW   APPearance CLEAR (A) CLEAR   Specific Gravity, Urine 1.008 1.005 - 1.030   pH 6.0 5.0 - 8.0   Glucose, UA  NEGATIVE NEGATIVE mg/dL   Hgb urine dipstick SMALL (A) NEGATIVE   Bilirubin Urine NEGATIVE NEGATIVE   Ketones, ur NEGATIVE NEGATIVE mg/dL   Protein, ur NEGATIVE NEGATIVE mg/dL   Nitrite NEGATIVE NEGATIVE   Leukocytes, UA NEGATIVE NEGATIVE   RBC / HPF 0-5 0 - 5 RBC/hpf   WBC, UA 0-5 0 - 5 WBC/hpf   Bacteria, UA NONE SEEN NONE SEEN   Squamous Epithelial / LPF NONE SEEN 0 - 5   Mucus PRESENT     Comment: Performed at [facility], [address] [place], [place] [address]  Troponin I - Once     Status: Abnormal   Collection Time: 09/01/18 12:04 PM  Result Value Ref Range   Troponin I 0.05 (HH) <0.03 ng/mL    Comment: CRITICAL VALUE NOTED. VALUE IS CONSISTENT WITH PREVIOUSLY REPORTED/CALLED VALUE DAS Performed at [facility], [address] [place], [place] [address]   Troponin I - Once     Status: Abnormal   Collection Time: 09/01/18  3:47 PM  Result Value Ref Range   Troponin I 0.05 (HH) <0.03 ng/mL    Comment: CRITICAL VALUE NOTED. VALUE IS CONSISTENT WITH PREVIOUSLY REPORTED/CALLED VALUE.  TFK Performed at [facility], [address] [place], [place] [address]   Magnesium     Status: None   Collection Time: 09/01/18  3:47 PM  Result Value Ref Range   Magnesium 2.0 1.7 - 2.4 mg/dL    Comment: Performed at [facility], [address] [place], [place] [address]   Dg Chest 2 View  Result Date: 09/01/2018 CLINICAL DATA:  dizziness x 1 day. States symptoms started yesterday evening at around 1730. Also C/O chest pain and blurry vision. Also c/o cough x 2-3 days. Patient states he has a history of HTN and has been off medication for about 2 months. Hx - HTN, non-smoker. EXAM: CHEST - 2 VIEW COMPARISON:  none FINDINGS: Lungs are clear. Heart size and mediastinal contours are within normal limits. No effusion. Visualized bones unremarkable. IMPRESSION: No acute cardiopulmonary disease. Electronically Signed   By: Corlis Leak M.D.   On: 09/01/2018  08:14  Ct Angio Chest/abd/pel For Dissection W And/or W/wo  Result Date: 09/01/2018 CLINICAL DATA:  Hypertension, blurred vision, dizziness, possible chest palpitations EXAM: CT ANGIOGRAPHY CHEST, ABDOMEN AND PELVIS TECHNIQUE: Multidetector CT imaging through the chest, abdomen and pelvis was performed using the standard protocol during bolus administration of intravenous contrast. Multiplanar reconstructed images and MIPs were obtained and reviewed to evaluate the vascular anatomy. CONTRAST:  75mL OMNIPAQUE IOHEXOL 350 MG/ML SOLN COMPARISON:  Chest radiographs dated 09/01/2018 FINDINGS: CTA CHEST FINDINGS Cardiovascular: On unenhanced CT, there is no evidence of intramural hematoma. No evidence of thoracic aortic aneurysm or dissection. Although not tailored for evaluation of the pulmonary arteries, there is no evidence of central pulmonary embolism. The heart is normal in size.  No pericardial effusion. Mild coronary atherosclerosis of the LAD. Mediastinum/Nodes: No suspicious mediastinal lymphadenopathy. Visualized thyroid is grossly unremarkable. Lungs/Pleura: Lungs are clear. No suspicious pulmonary nodules. No focal consolidation. No pleural effusion or pneumothorax. Musculoskeletal: Visualized osseous structures are within normal limits. Review of the MIP images confirms the above findings. CTA ABDOMEN AND PELVIS FINDINGS VASCULAR Aorta: No evidence of abdominal aortic aneurysm or dissection. Patent. Celiac: Patent. SMA: Patent. Renals: Patent bilaterally. IMA: Patent. Inflow: Patent. Veins: Grossly unremarkable. Review of the MIP images confirms the above findings. NON-VASCULAR Hepatobiliary: Mild hepatic steatosis. Gallbladder is unremarkable. No intrahepatic or extrahepatic dilatation. Pancreas: Within normal limits. Spleen: Within normal limits. Adrenals/Urinary Tract: Adrenal glands are within normal limits. Kidneys are within normal limits.  No hydronephrosis. Bladder is within normal limits.  Stomach/Bowel: Stomach is within normal limits. No evidence of bowel obstruction. Normal appendix (series 6/image 150). Lymphatic: No suspicious abdominopelvic lymphadenopathy. Reproductive: Prostate is unremarkable. Other: No abdominopelvic ascites. Musculoskeletal: Mild degenerative changes of the lumbar spine, most prominent at L5-S1. Review of the MIP images confirms the above findings. IMPRESSION: No evidence of thoracoabdominal aortic aneurysm or dissection. No evidence of acute cardiopulmonary disease. Mild hepatic steatosis.  Otherwise unremarkable CT abdomen/pelvis. Electronically Signed   By: Charline BillsSriyesh  Krishnan M.D.   On: 09/01/2018 10:02    Pending Labs Unresulted Labs (From admission, onward)    Start     Ordered   09/08/18 0500  Creatinine, serum  (enoxaparin (LOVENOX)    CrCl >/= 30 ml/min)  Weekly,   STAT    Comments:  while on enoxaparin therapy    09/01/18 0930   09/02/18 0500  Basic metabolic panel  Tomorrow morning,   STAT     09/01/18 0930   09/02/18 0500  CBC  Tomorrow morning,   STAT     09/01/18 0930   09/02/18 0500  Lipid panel  Tomorrow morning,   STAT     09/01/18 0932   09/01/18 0934  Urine Drug Screen, Qualitative ([facility] only)  Once,   STAT     09/01/18 0933   09/01/18 0931  HIV antibody (Routine Testing)  Add-on,   AD     09/01/18 0930          Vitals/Pain Today's Vitals   09/01/18 1630 09/01/18 1830 09/01/18 1835 09/01/18 1900  BP: 138/90 (!) 135/94 (!) 135/94 134/80  Pulse: 85 85  63  Resp: 14 20  16   Temp:      TempSrc:      SpO2: 100% 100%  99%  Weight:      Height:      PainSc:        Isolation Precautions No active isolations  Medications Medications  lisinopril (PRINIVIL,ZESTRIL) tablet 20 mg (20 mg Oral Given  09/01/18 1038)  hydrALAZINE (APRESOLINE) injection 5 mg (has no administration in time range)  enoxaparin (LOVENOX) injection 40 mg (40 mg Subcutaneous Given 09/01/18 1038)  acetaminophen (TYLENOL) tablet 650 mg (has no administration  in time range)    Or  acetaminophen (TYLENOL) suppository 650 mg (has no administration in time range)  ondansetron (ZOFRAN) tablet 4 mg (has no administration in time range)    Or  ondansetron (ZOFRAN) injection 4 mg (has no administration in time range)  spironolactone (ALDACTONE) tablet 12.5 mg (12.5 mg Oral Given 09/01/18 1158)  furosemide (LASIX) tablet 20 mg (has no administration in time range)  aspirin EC tablet 81 mg (has no administration in time range)  carvedilol (COREG) tablet 6.25 mg (6.25 mg Oral Not Given 09/01/18 1835)  sodium chloride flush (NS) 0.9 % injection 3 mL (3 mLs Intravenous Given 09/01/18 0830)  cloNIDine (CATAPRES) tablet 0.2 mg (0.2 mg Oral Given 09/01/18 0828)  furosemide (LASIX) injection 60 mg (60 mg Intravenous Given 09/01/18 0830)  aspirin chewable tablet 324 mg (324 mg Oral Given 09/01/18 0856)  iohexol (OMNIPAQUE) 350 MG/ML injection 75 mL (75 mLs Intravenous Contrast Given 09/01/18 0945)    Mobility Walks without assistance

## 2018-09-02 ENCOUNTER — Inpatient Hospital Stay (HOSPITAL_COMMUNITY)
Admit: 2018-09-02 | Discharge: 2018-09-02 | Disposition: A | Discharge status: Home / Self Care | Payer: Self-pay | Attending: Internal Medicine | Admitting: Internal Medicine

## 2018-09-02 DIAGNOSIS — I160 Hypertensive urgency: Principal | ICD-10-CM

## 2018-09-02 LAB — BASIC METABOLIC PANEL
ANION GAP: 3 — AB (ref 5–15)
BUN: 15 mg/dL (ref 6–20)
CO2: 33 mmol/L — ABNORMAL HIGH (ref 22–32)
Calcium: 8.6 mg/dL — ABNORMAL LOW (ref 8.9–10.3)
Chloride: 105 mmol/L (ref 98–111)
Creatinine, Ser: 1.23 mg/dL (ref 0.61–1.24)
GFR calc Af Amer: >60 mL/min (ref >60)
Glucose, Bld: 166 mg/dL — ABNORMAL HIGH (ref 70–99)
POTASSIUM: 3.7 mmol/L (ref 3.5–5.1)
Sodium: 141 mmol/L (ref 135–145)

## 2018-09-02 LAB — CBC
HCT: 43.3 % (ref 39.0–52.0)
Hemoglobin: 13.8 g/dL (ref 13.0–17.0)
MCH: 28 pg (ref 26.0–34.0)
MCHC: 31.9 g/dL (ref 30.0–36.0)
MCV: 88 fL (ref 80.0–100.0)
Platelets: 310 10*3/uL (ref 150–400)
RBC: 4.92 MIL/uL (ref 4.22–5.81)
RDW: 13.1 % (ref 11.5–15.5)
WBC: 7.8 10*3/uL (ref 4.0–10.5)
nRBC: 0 % (ref 0.0–0.2)

## 2018-09-02 LAB — ECHOCARDIOGRAM COMPLETE
Height: 66 in
Weight: 3694.4 oz

## 2018-09-02 LAB — LIPID PANEL
CHOLESTEROL: 220 mg/dL — AB (ref 0–200)
HDL: 42 mg/dL (ref >40)
LDL Cholesterol: 148 mg/dL — ABNORMAL HIGH (ref 0–99)
Total CHOL/HDL Ratio: 5.2 RATIO
Triglycerides: 148 mg/dL (ref <150)
VLDL: 30 mg/dL (ref 0–40)

## 2018-09-02 LAB — HEMOGLOBIN A1C
Hgb A1c MFr Bld: 7.6 % — ABNORMAL HIGH (ref 4.8–5.6)
Mean Plasma Glucose: 171.42 mg/dL

## 2018-09-02 LAB — HIV ANTIBODY (ROUTINE TESTING W REFLEX): HIV Screen 4th Generation wRfx: NONREACTIVE

## 2018-09-02 MED ORDER — FUROSEMIDE 20 MG PO TABS: 20.0000 mg | ORAL_TABLET | Freq: Every day | ORAL | 0 refills | Status: AC

## 2018-09-02 MED ORDER — HYDRALAZINE HCL 10 MG PO TABS
10.0000 mg | ORAL_TABLET | Freq: Three times a day (TID) | ORAL | Status: DC
  Filled 2018-09-02 (×2): qty 1

## 2018-09-02 MED ORDER — HYDRALAZINE HCL 10 MG PO TABS: 10.0000 mg | ORAL_TABLET | Freq: Three times a day (TID) | ORAL | 0 refills | Status: DC

## 2018-09-02 MED ORDER — LISINOPRIL 20 MG PO TABS: 20.0000 mg | ORAL_TABLET | Freq: Every day | ORAL | 0 refills | Status: DC

## 2018-09-02 MED ORDER — CARVEDILOL 3.125 MG PO TABS: 3.1250 mg | ORAL_TABLET | Freq: Two times a day (BID) | ORAL | 0 refills | Status: DC

## 2018-09-02 MED ORDER — ASPIRIN 81 MG PO TBEC: 81.0000 mg | DELAYED_RELEASE_TABLET | Freq: Every day | ORAL | 0 refills | Status: AC

## 2018-09-02 MED ORDER — SPIRONOLACTONE 25 MG PO TABS: 12.5000 mg | ORAL_TABLET | Freq: Every day | ORAL | 0 refills | Status: DC

## 2018-09-02 NOTE — Progress Notes (Signed)
Patient alert and oriented, vss, no complaints of pain.  Escorted out of hospital via wheelchair by volunteers.   

## 2018-09-02 NOTE — Discharge Summary (Signed)
[employer] - [place] at [facility]   PATIENT NAME: Tom Holmes    MR#:  [id number]  DATE OF BIRTH:  Oct 05, 1970  DATE OF ADMISSION:  09/01/2018 ADMITTING PHYSICIAN: Alford Highland, MD  DATE OF DISCHARGE: 09/02/2018  PRIMARY CARE PHYSICIAN: Patient, No Pcp Per    ADMISSION DIAGNOSIS:  Hypertensive urgency [I16.0]  DISCHARGE DIAGNOSIS:  Active Problems:   Hypertensive urgency   SECONDARY DIAGNOSIS:   Past Medical History:  Diagnosis Date  . Cardiomyopathy (HCC)   . Diabetes mellitus without complication (HCC)   . Hypertension     HOSPITAL COURSE:   1.  Hypertensive urgency.  The patient has been off hypertensive medications for the past 2 years so the blood pressure likely high for a long period of time.  I did not want to lower his blood pressure too quickly.  Started low-dose lisinopril spironolactone Coreg and Lasix.  Last blood pressure 143/91. 2.  Chest pain and borderline troponin secondary to hyper tensive urgency and demand ischemia.  The patient did have a history of cardiomyopathy.  Echocardiogram showed EF 45 to 50%.  Follow-up with cardiology as outpatient. 3.  Cocaine use.  Patient stated he snorted cocaine last week and he will stop.  Cardiology okay with prescribing Coreg as outpatient. 4.  Impaired fasting glucose.  I did send off a hemoglobin A1c.  This is still pending at this point. 5.  Hyperlipidemia unspecified.  LDL 148.  Can consider cholesterol medication as outpatient but I will hold off here. 6.  Recommend checking a BMP and follow-up appointment   DISCHARGE CONDITIONS:   Satisfactory  CONSULTS OBTAINED:  Treatment Team:  Iran Ouch, MD End, Cristal Deer, MD  DRUG ALLERGIES:  No Known Allergies  DISCHARGE MEDICATIONS:   Allergies as of 09/02/2018   No Known Allergies     Medication List    TAKE these medications   aspirin 81 MG EC tablet Take 1 tablet (81 mg total) by mouth daily. Start taking on:  September 03, 2018   carvedilol 3.125 MG tablet Commonly known as:  COREG Take 1 tablet (3.125 mg total) by mouth 2 (two) times daily.   furosemide 20 MG tablet Commonly known as:  LASIX Take 1 tablet (20 mg total) by mouth daily. Start taking on:  September 03, 2018   lisinopril 20 MG tablet Commonly known as:  PRINIVIL,ZESTRIL Take 1 tablet (20 mg total) by mouth daily. Start taking on:  September 03, 2018   spironolactone 25 MG tablet Commonly known as:  ALDACTONE Take 0.5 tablets (12.5 mg total) by mouth daily. Start taking on:  September 03, 2018        DISCHARGE INSTRUCTIONS:   Follow-up cardiology couple weeks. Follow-up new medical doctor in a few weeks.  If you experience worsening of your admission symptoms, develop shortness of breath, life threatening emergency, suicidal or homicidal thoughts you must seek medical attention immediately by calling 911 or calling your MD immediately  if symptoms less severe.  You Must read complete instructions/literature along with all the possible adverse reactions/side effects for all the Medicines you take and that have been prescribed to you. Take any new Medicines after you have completely understood and accept all the possible adverse reactions/side effects.   Please note  You were cared for by a hospitalist during your hospital stay. If you have any questions about your discharge medications or the care you received while you were in the hospital after you are discharged, you can  call the unit and asked to speak with the hospitalist on call if the hospitalist that took care of you is not available. Once you are discharged, your primary care physician will handle any further medical issues. Please note that NO REFILLS for any discharge medications will be authorized once you are discharged, as it is imperative that you return to your primary care physician (or establish a relationship with a primary care physician if you do not have one) for your  aftercare needs so that they can reassess your need for medications and monitor your lab values.    Today   CHIEF COMPLAINT:  No chief complaint on file.   HISTORY OF PRESENT ILLNESS:  Tom Holmes  is a 48 y.o. male came in with headache blurry vision and chest discomfort.   VITAL SIGNS:  Blood pressure (!) 143/91, pulse 66, temperature 98.4 F (36.9 C), temperature source Oral, resp. rate 16, height 5\' 6"  (1.676 m), weight 104.7 kg, SpO2 99 %.    PHYSICAL EXAMINATION:  GENERAL:  48 y.o.-year-old patient lying in the bed with no acute distress.  EYES: Pupils equal, round, reactive to light and accommodation. No scleral icterus. Extraocular muscles intact.  HEENT: Head atraumatic, normocephalic. Oropharynx and nasopharynx clear.  NECK:  Supple, no jugular venous distention. No thyroid enlargement, no tenderness.  LUNGS: Normal breath sounds bilaterally, no wheezing, rales,rhonchi or crepitation. No use of accessory muscles of respiration.  CARDIOVASCULAR: S1, S2 normal. No murmurs, rubs, or gallops.  ABDOMEN: Soft, non-tender, non-distended. Bowel sounds present. No organomegaly or mass.  EXTREMITIES: No pedal edema, cyanosis, or clubbing.  NEUROLOGIC: Cranial nerves II through XII are intact. Muscle strength 5/5 in all extremities. Sensation intact. Gait not checked.  PSYCHIATRIC: The patient is alert and oriented x 3.  SKIN: No obvious rash, lesion, or ulcer.   DATA REVIEW:   CBC Recent Labs  Lab 09/02/18 0501  WBC 7.8  HGB 13.8  HCT 43.3  PLT 310    Chemistries  Recent Labs  Lab 09/01/18 0733 09/01/18 1547 09/02/18 0501  NA 137  --  141  K 3.8  --  3.7  CL 104  --  105  CO2 27  --  33*  GLUCOSE 155*  --  166*  BUN 16  --  15  CREATININE 1.11  --  1.23  CALCIUM 8.5*  --  8.6*  MG  --  2.0  --   AST 27  --   --   ALT 22  --   --   ALKPHOS 82  --   --   BILITOT 1.0  --   --     Cardiac Enzymes Recent Labs  Lab 09/01/18 1547  TROPONINI 0.05*       RADIOLOGY:  Dg Chest 2 View  Result Date: 09/01/2018 CLINICAL DATA:  dizziness x 1 day. States symptoms started yesterday evening at around 1730. Also C/O chest pain and blurry vision. Also c/o cough x 2-3 days. Patient states he has a history of HTN and has been off medication for about 2 months. Hx - HTN, non-smoker. EXAM: CHEST - 2 VIEW COMPARISON:  none FINDINGS: Lungs are clear. Heart size and mediastinal contours are within normal limits. No effusion. Visualized bones unremarkable. IMPRESSION: No acute cardiopulmonary disease. Electronically Signed   By: Corlis Leak M.D.   On: 09/01/2018 08:14   Ct Angio Chest/abd/pel For Dissection W And/or W/wo  Result Date: 09/01/2018 CLINICAL DATA:  Hypertension, blurred vision, dizziness, possible  chest palpitations EXAM: CT ANGIOGRAPHY CHEST, ABDOMEN AND PELVIS TECHNIQUE: Multidetector CT imaging through the chest, abdomen and pelvis was performed using the standard protocol during bolus administration of intravenous contrast. Multiplanar reconstructed images and MIPs were obtained and reviewed to evaluate the vascular anatomy. CONTRAST:  75mL OMNIPAQUE IOHEXOL 350 MG/ML SOLN COMPARISON:  Chest radiographs dated 09/01/2018 FINDINGS: CTA CHEST FINDINGS Cardiovascular: On unenhanced CT, there is no evidence of intramural hematoma. No evidence of thoracic aortic aneurysm or dissection. Although not tailored for evaluation of the pulmonary arteries, there is no evidence of central pulmonary embolism. The heart is normal in size.  No pericardial effusion. Mild coronary atherosclerosis of the LAD. Mediastinum/Nodes: No suspicious mediastinal lymphadenopathy. Visualized thyroid is grossly unremarkable. Lungs/Pleura: Lungs are clear. No suspicious pulmonary nodules. No focal consolidation. No pleural effusion or pneumothorax. Musculoskeletal: Visualized osseous structures are within normal limits. Review of the MIP images confirms the above findings. CTA ABDOMEN  AND PELVIS FINDINGS VASCULAR Aorta: No evidence of abdominal aortic aneurysm or dissection. Patent. Celiac: Patent. SMA: Patent. Renals: Patent bilaterally. IMA: Patent. Inflow: Patent. Veins: Grossly unremarkable. Review of the MIP images confirms the above findings. NON-VASCULAR Hepatobiliary: Mild hepatic steatosis. Gallbladder is unremarkable. No intrahepatic or extrahepatic dilatation. Pancreas: Within normal limits. Spleen: Within normal limits. Adrenals/Urinary Tract: Adrenal glands are within normal limits. Kidneys are within normal limits.  No hydronephrosis. Bladder is within normal limits. Stomach/Bowel: Stomach is within normal limits. No evidence of bowel obstruction. Normal appendix (series 6/image 150). Lymphatic: No suspicious abdominopelvic lymphadenopathy. Reproductive: Prostate is unremarkable. Other: No abdominopelvic ascites. Musculoskeletal: Mild degenerative changes of the lumbar spine, most prominent at L5-S1. Review of the MIP images confirms the above findings. IMPRESSION: No evidence of thoracoabdominal aortic aneurysm or dissection. No evidence of acute cardiopulmonary disease. Mild hepatic steatosis.  Otherwise unremarkable CT abdomen/pelvis. Electronically Signed   By: Charline BillsSriyesh  Krishnan M.D.   On: 09/01/2018 10:02      Management plans discussed with the patient, and he is in agreement.  CODE STATUS:     Code Status Orders  (From admission, onward)         Start     Ordered   09/01/18 0931  Full code  Continuous     09/01/18 0930        Code Status History    This patient has a current code status but no historical code status.      TOTAL TIME TAKING CARE OF THIS PATIENT: 35 minutes.    Alford Highlandichard Phinley Schall M.D on 09/02/2018 at 2:10 PM  Between 7am to 6pm - Pager - [phone]  After 6pm go to [email or web address] - password [email or web address]  [employer] Office  [phone]  CC: Primary care physician; Patient, No Pcp Per

## 2018-09-02 NOTE — Progress Notes (Signed)
Progress Note  Patient Name: Tom Holmes Date of Encounter: 09/02/2018  Primary Cardiologist: [facility], Dr. Kirke CorinArida  Subjective   Patient reporting he feels improved when compared with yesterday.  He denies any complaint of dizziness, palpitations, racing heart rate. He did note a continued feeling of tightness in his central chest/esophagus since yesterday; however, he was unable to describe this feeling further and feels it improved.  He thinks it may be related to his elevated blood pressure.  We discussed his medications at length.  To call compliance and checking his blood pressure at home were encouraged.  It was recommended that he follow-up and establish with a PCP.  It was explained that he should not use cocaine with beta-blockers.  Inpatient Medications    Scheduled Meds: . aspirin EC  81 mg Oral Daily  . enoxaparin (LOVENOX) injection  40 mg Subcutaneous Q24H  . furosemide  20 mg Oral Daily  . Influenza vac split quadrivalent PF  0.5 mL Intramuscular Tomorrow-1000  . lisinopril  20 mg Oral Daily  . spironolactone  12.5 mg Oral Daily   Continuous Infusions:  PRN Meds: acetaminophen **OR** acetaminophen, hydrALAZINE, ondansetron **OR** ondansetron (ZOFRAN) IV   Vital Signs    Vitals:   09/02/18 0500 09/02/18 0636 09/02/18 0724 09/02/18 1259  BP: (!) 150/104 (!) 166/105 (!) 155/99 (!) 143/91  Pulse: 63 65 66   Resp: 16  16   Temp: 98.4 F (36.9 C)  98.4 F (36.9 C)   TempSrc: Oral  Oral   SpO2: 98% 100% 99%   Weight:      Height:        Intake/Output Summary (Last 24 hours) at 09/02/2018 1407 Last data filed at 09/02/2018 1026 Gross per 24 hour  Intake 360 ml  Output 250 ml  Net 110 ml   Filed Weights   09/01/18 0729 09/01/18 2015  Weight: 108.9 kg 104.7 kg    Telemetry    SR - Personally Reviewed  ECG    No new tracings - Personally Reviewed  Physical Exam   GEN: No acute distress.  Watching tv.  Neck: No JVD Cardiac: RRR, no murmurs, rubs,  or gallops.  Respiratory: Clear to auscultation bilaterally. GI: Soft, nontender, non-distended  MS: No edema; No deformity. Neuro:  Nonfocal  Psych: Normal affect   Labs    Chemistry Recent Labs  Lab 09/01/18 0733 09/02/18 0501  NA 137 141  K 3.8 3.7  CL 104 105  CO2 27 33*  GLUCOSE 155* 166*  BUN 16 15  CREATININE 1.11 1.23  CALCIUM 8.5* 8.6*  PROT 7.7  --   ALBUMIN 3.7  --   AST 27  --   ALT 22  --   ALKPHOS 82  --   BILITOT 1.0  --   GFRNONAA >60 >60  GFRAA >60 >60  ANIONGAP 6 3*     Hematology Recent Labs  Lab 09/01/18 0733 09/02/18 0501  WBC 9.4 7.8  RBC 5.13 4.92  HGB 14.3 13.8  HCT 44.4 43.3  MCV 86.5 88.0  MCH 27.9 28.0  MCHC 32.2 31.9  RDW 13.1 13.1  PLT 363 310    Cardiac Enzymes Recent Labs  Lab 09/01/18 0733 09/01/18 1204 09/01/18 1547  TROPONINI 0.05* 0.05* 0.05*   No results for input(s): TROPIPOC in the last 168 hours.   BNP Recent Labs  Lab 09/01/18 0733  BNP 60.0     DDimer No results for input(s): DDIMER in the last 168  hours.   Radiology    Dg Chest 2 View  Result Date: 09/01/2018 CLINICAL DATA:  dizziness x 1 day. States symptoms started yesterday evening at around 1730. Also C/O chest pain and blurry vision. Also c/o cough x 2-3 days. Patient states he has a history of HTN and has been off medication for about 2 months. Hx - HTN, non-smoker. EXAM: CHEST - 2 VIEW COMPARISON:  none FINDINGS: Lungs are clear. Heart size and mediastinal contours are within normal limits. No effusion. Visualized bones unremarkable. IMPRESSION: No acute cardiopulmonary disease. Electronically Signed   By: Corlis Leak  Hassell M.D.   On: 09/01/2018 08:14   Ct Angio Chest/abd/pel For Dissection W And/or W/wo  Result Date: 09/01/2018 CLINICAL DATA:  Hypertension, blurred vision, dizziness, possible chest palpitations EXAM: CT ANGIOGRAPHY CHEST, ABDOMEN AND PELVIS TECHNIQUE: Multidetector CT imaging through the chest, abdomen and pelvis was performed using  the standard protocol during bolus administration of intravenous contrast. Multiplanar reconstructed images and MIPs were obtained and reviewed to evaluate the vascular anatomy. CONTRAST:  75mL OMNIPAQUE IOHEXOL 350 MG/ML SOLN COMPARISON:  Chest radiographs dated 09/01/2018 FINDINGS: CTA CHEST FINDINGS Cardiovascular: On unenhanced CT, there is no evidence of intramural hematoma. No evidence of thoracic aortic aneurysm or dissection. Although not tailored for evaluation of the pulmonary arteries, there is no evidence of central pulmonary embolism. The heart is normal in size.  No pericardial effusion. Mild coronary atherosclerosis of the LAD. Mediastinum/Nodes: No suspicious mediastinal lymphadenopathy. Visualized thyroid is grossly unremarkable. Lungs/Pleura: Lungs are clear. No suspicious pulmonary nodules. No focal consolidation. No pleural effusion or pneumothorax. Musculoskeletal: Visualized osseous structures are within normal limits. Review of the MIP images confirms the above findings. CTA ABDOMEN AND PELVIS FINDINGS VASCULAR Aorta: No evidence of abdominal aortic aneurysm or dissection. Patent. Celiac: Patent. SMA: Patent. Renals: Patent bilaterally. IMA: Patent. Inflow: Patent. Veins: Grossly unremarkable. Review of the MIP images confirms the above findings. NON-VASCULAR Hepatobiliary: Mild hepatic steatosis. Gallbladder is unremarkable. No intrahepatic or extrahepatic dilatation. Pancreas: Within normal limits. Spleen: Within normal limits. Adrenals/Urinary Tract: Adrenal glands are within normal limits. Kidneys are within normal limits.  No hydronephrosis. Bladder is within normal limits. Stomach/Bowel: Stomach is within normal limits. No evidence of bowel obstruction. Normal appendix (series 6/image 150). Lymphatic: No suspicious abdominopelvic lymphadenopathy. Reproductive: Prostate is unremarkable. Other: No abdominopelvic ascites. Musculoskeletal: Mild degenerative changes of the lumbar spine, most  prominent at L5-S1. Review of the MIP images confirms the above findings. IMPRESSION: No evidence of thoracoabdominal aortic aneurysm or dissection. No evidence of acute cardiopulmonary disease. Mild hepatic steatosis.  Otherwise unremarkable CT abdomen/pelvis. Electronically Signed   By: Charline BillsSriyesh  Krishnan M.D.   On: 09/01/2018 10:02    Cardiac Studies   09/02/2018 TTE  1. The left ventricle has mildly reduced systolic function of 45-50%. The cavity size is normal. There is moderate left ventricular wall thickness. Echo evidence of impaired relaxation diastolic filling patterns.  2. Mildly dilated left atrial size.  3. Normal right atrial size.  4. Normal tricuspid valve.  5. The aortic valve tricuspid. There is mild thickening of the aortic valve.  6. The aortic root is normal is size and structure.  7. No atrial level shunt detected by color flow Doppler.   03/26/2017 TTE Findings   Left Ventricle  Left ventricle size is normal.  Mildly increased left ventricle wall thickness.  The left ventricular systolic function is mildly decreased with estimated  LV ejection fraction of 45-50%.  There is mild global hypokinesis of  the LV wall.  Left Atrium  Mildly dilated left atrium.  No evidence of thrombus within left atrium.  No evidence of atrial septal defect.  No evidence of intracardiac shunting by color flow Doppler.  Right Atrium  Mildly dilated right atrium.  No evidence of thrombus or mass in the right atrium.  Right Ventricle  Mildly enlarged right ventricle cavity.  No evidence of any mass in the right ventricle.  Mitral Valve  The mitral valve leaflets are mildly thickened.  Mild Mitral Annular Calcification  No evidence of mitral valve stenosis.  Trace mitral regurgitation is present.  Tricuspid Valve  The tricuspid valve is normal in structure and function.  No evidence of tricuspid stenosis.  No evidence of tricuspid regurgitation.  Aortic Valve  Aortic valve  leaflets are somewhat thickened.  The aortic valve is trileaflet.  No hemodynamically significant valvular aortic stenosis.  No evidence of aortic valve regurgitation.  Pulmonic Valve  Thickened pulmonic valve leaflets with good excursion.  Trace pulmonic regurgitation present.  Miscellaneous  The aorta is within normal limits.  Aortic root dimension within normal limits.  Aortic root is somewhat fibrocalcified.  The Pulmonary artery is within normal limits.  The IVC is normal.  Pericardial Effusion  There is no pericardial effusion.  Patient Profile     47 y.o. male with a hx of HTN, HFrEF (EF 20-25%  45-50%), diabetes mellitus with neuropathy, hypertension, obesity, CKD 3, substance abuse with past history of cocaine and alcohol abuse, and medication non-compliance who is being seen today for elevated BP/HTN in setting of recent cocaine use.  Assessment & Plan    Hypertensive Cardiomyopathy,NICM, HFrEF  - Uncontrolled HTN, recent cocaine use. Euvolemic on exam. Echo as above showed EF 45 to 50%.  Moderate left ventricular wall thickness.  Evidence of impaired relaxation diastolic filling patterns.  Mildly dilated left atrium and mild thickening of the aortic valve. - Continue medical management with ASA, Coreg (switched from Toprol given cardiomyopathy and recent cocaine use), lisinopril po 20mg  daily, lasix 20mg  po, and spironolactone. All medications generic and should be easily obtained.  Medication compliance stressed. - Recommendation for aggressive risk factor modification with tight control of BP/home BP checks, lifestyle changes (diet, exercise), medication compliance, DM management with tight glucose control, and daily weights. Discussed establishing with PCP.  Recommend follow-up outpatient BMET  Elevated troponin - Current chest tightness, though improved from yesterday. EKG without acute changes. Elevated troponin 0.05, flat trending and in the setting of elevated BP and  rapid rate and consistent with supply demand ischemia but cannot completely rule out cardiac ischemia given risk factors below and previous echo with hypokinesis / uncontrolled HTN. As previously documented, suspicion for underlying nonischemic cardiomyopathy as patient has never had previous ischemic cardiac evaluation. - Continue medical management as above. Careful attention to daily renal function and electrolytes (potassium) while on ACE and spironolactone. LDL elevated at 148 with recommendation for statin.  Liver function normal.  Once started on statin, will need updated liver and lipid panel in ~8 weeks. - No need for ischemic workup this admission; however, future ischemic workup as outpatient recommended given risk factors as above.  Hypokalemia, mild - Started on spironolactone. Follow-up BMET recommended  History of substance abuse, including cocaine and alcohol - Cocaine cessation advised with start of Coreg as above  CKD3 - Recommend outpatient BMET  DM2 - Recommend outpatient follow-up with PCP  Medication noncompliance - Generic medications supplied and patient expressed understanding of importance of  compliance  For questions or updates, please contact [facility] Please consult [email or web address] for contact info under        Signed, Lennon Alstrom, PA-C  09/02/2018, 2:07 PM

## 2018-09-02 NOTE — Progress Notes (Signed)
Patient ID: Tom Holmes  Work Note.  Patient admitted to [facility] 09/01/2018 and discharge 09/02/2018.  Please excuse from work  Through Feb 6th.  Return to work 09/04/2018.  Dr Alford Highlandichard Rainna Nearhood [phone]

## 2018-09-02 NOTE — Progress Notes (Signed)
*  PRELIMINARY RESULTS* Echocardiogram 2D Echocardiogram has been performed.  Tom Holmes 09/02/2018, 8:53 AM

## 2018-09-23 NOTE — Progress Notes (Deleted)
Cardiology Office Note Date:  09/23/2018  Patient ID:  Tom Holmes, DOB [birth year], MRN [id number] PCP:  Patient, No Pcp Per  Cardiologist:  Dr. Kirke Corin, MD  ***refresh   Chief Complaint: Hospital follow up  History of Present Illness: Tom Holmes is a 48 y.o. male with history of chronic systolic CHF secondary to suspected hypertensive heart disease and nonischemic cardiomyopathy, type 2 diabetes, CKD stage ***, hypertension,, polysubstance abuse including ongoing alcohol and cocaine abuse, and obesity who presents for hospital follow-up after recent admission to [facility] from 2/3 through 2/4 for ***.  Prior echo in 2017 during hospitalization at outside facility during admission for hypertensive urgency showed an EF of 20 to 25% with mild to moderate concentric LVH with severe hypokinesis and mild mitral regurgitation with an RV systolic pressure of 49.  Patient reportedly was unable to afford cardiac cath, thus ischemic evaluation was deferred.  He was medically managed for hypertension and systolic heart failure.  However, records indicate he was not compliant with medications.  He was readmitted in 2018 for worsening shortness of breath, again in the setting of ongoing cocaine abuse and medication and dietary noncompliance.  Repeat echo showed an EF of 40 to 45%.  He was most recently admitted to the hospital in 08/2018 in the setting of medication and dietary noncompliance as well as polysubstance abuse with ongoing alcohol and cocaine abuse.  Troponin was noted to be minimally elevated and flat trending at 0.05.  Blood pressure was suboptimally controlled with a reading of 166/112.  He reported his most recent use of cocaine was 1 week prior.  CT of the chest showed no aortic dissection.  Echo on 09/02/2018 showed an EF of 45 to 50%, moderate LVH, diastolic dysfunction, mildly dilated left atrium, mild thickening of the aortic valve, aortic root was normal in size and structure.  It was  recommended he establish with [place] to undergo formal ischemic evaluation given his cardiomyopathy.  Labs: Total cholesterol 220, triglycerides 148, HDL 42, LDL 148, A1c 7.6, potassium 3.7, serum creatinine 1.23, CBC unremarkable, magnesium 2.0, urine drug screen positive for cocaine, BNP 60  Documented discharge weight: 104.7 kg  Discharge medications: Aspirin, Coreg, Lasix, lisinopril, spironolactone   ***   Past Medical History:  Diagnosis Date  . Cardiomyopathy (HCC)   . Diabetes mellitus without complication (HCC)   . Hypertension     Past Surgical History:  Procedure Laterality Date  . NO PAST SURGERIES      No outpatient medications have been marked as taking for the 09/25/18 encounter (Appointment) with Sondra Barges, PA-C.    Allergies:   Patient has no known allergies.   Social History:  The patient  reports that he has never smoked. He has never used smokeless tobacco. He reports current alcohol use. He reports that he does not use drugs.   Family History:  The patient's family history includes Hypertension in his mother.  ROS:   ROS   PHYSICAL EXAM: *** VS:  There were no vitals taken for this visit. BMI: There is no height or weight on file to calculate BMI.  Physical Exam   EKG:  Was ordered and interpreted by me today. Shows ***  Recent Labs: 09/01/2018: ALT 22; B Natriuretic Peptide 60.0; Magnesium 2.0 09/02/2018: BUN 15; Creatinine, Ser 1.23; Hemoglobin 13.8; Platelets 310; Potassium 3.7; Sodium 141  09/02/2018: Cholesterol 220; HDL 42; LDL Cholesterol 148; Total CHOL/HDL Ratio 5.2; Triglycerides 148; VLDL 30   CrCl cannot be calculated (  Patient's most recent lab result is older than the maximum 21 days allowed.).   Wt Readings from Last 3 Encounters:  09/01/18 230 lb 14.4 oz (104.7 kg)     Other studies reviewed: Additional studies/records reviewed today include: summarized above  ASSESSMENT AND PLAN:  1. ***  Disposition: F/u with Dr. Kirke Corin or an  APP in ***  Current medicines are reviewed at length with the patient today.  The patient did not have any concerns regarding medicines.  Signed, Eula Listen, PA-C 09/23/2018 7:47 AM     [facility] [place] [address] [place], [place] [address] [phone]

## 2018-09-25 ENCOUNTER — Ambulatory Visit: Payer: Self-pay | Admitting: Physician Assistant

## 2018-09-26 ENCOUNTER — Encounter: Payer: Self-pay | Admitting: Physician Assistant

## 2018-12-22 ENCOUNTER — Encounter: Payer: Self-pay | Admitting: Emergency Medicine

## 2018-12-22 ENCOUNTER — Other Ambulatory Visit: Payer: Self-pay

## 2018-12-22 ENCOUNTER — Emergency Department
Admission: EM | Admit: 2018-12-22 | Discharge: 2018-12-22 | Disposition: A | Discharge status: Home / Self Care | Payer: Self-pay | Attending: Emergency Medicine | Admitting: Emergency Medicine

## 2018-12-22 DIAGNOSIS — E119 Type 2 diabetes mellitus without complications: Secondary | ICD-10-CM | POA: Insufficient documentation

## 2018-12-22 DIAGNOSIS — Z79899 Other long term (current) drug therapy: Secondary | ICD-10-CM | POA: Insufficient documentation

## 2018-12-22 DIAGNOSIS — Z7982 Long term (current) use of aspirin: Secondary | ICD-10-CM | POA: Insufficient documentation

## 2018-12-22 DIAGNOSIS — I10 Essential (primary) hypertension: Secondary | ICD-10-CM | POA: Insufficient documentation

## 2018-12-22 DIAGNOSIS — L239 Allergic contact dermatitis, unspecified cause: Secondary | ICD-10-CM | POA: Insufficient documentation

## 2018-12-22 LAB — GLUCOSE, CAPILLARY: Glucose-Capillary: 163 mg/dL — ABNORMAL HIGH (ref 70–99)

## 2018-12-22 MED ORDER — CARVEDILOL 3.125 MG PO TABS: 3.1250 mg | ORAL_TABLET | Freq: Two times a day (BID) | ORAL | 0 refills | Status: AC

## 2018-12-22 MED ORDER — CARVEDILOL 6.25 MG PO TABS
3.1250 mg | ORAL_TABLET | ORAL | Status: AC
  Administered 2018-12-22: 3.125 mg via ORAL
  Filled 2018-12-22: qty 1

## 2018-12-22 MED ORDER — SPIRONOLACTONE 25 MG PO TABS: 12.5000 mg | ORAL_TABLET | Freq: Every day | ORAL | 0 refills | Status: AC

## 2018-12-22 MED ORDER — SPIRONOLACTONE 25 MG PO TABS
12.5000 mg | ORAL_TABLET | Freq: Every day | ORAL | Status: DC
  Administered 2018-12-22: 12.5 mg via ORAL
  Filled 2018-12-22: qty 0.5

## 2018-12-22 MED ORDER — PREDNISONE 20 MG PO TABS: 40.0000 mg | ORAL_TABLET | Freq: Every day | ORAL | 0 refills | Status: AC

## 2018-12-22 MED ORDER — SPIRONOLACTONE 25 MG PO TABS
25.0000 mg | ORAL_TABLET | Freq: Every day | ORAL | Status: DC
  Filled 2018-12-22 (×2): qty 1

## 2018-12-22 MED ORDER — PREDNISONE 20 MG PO TABS
40.0000 mg | ORAL_TABLET | Freq: Once | ORAL | Status: AC
  Administered 2018-12-22: 40 mg via ORAL
  Filled 2018-12-22: qty 2

## 2018-12-22 MED ORDER — DIPHENHYDRAMINE HCL 25 MG PO CAPS
50.0000 mg | ORAL_CAPSULE | Freq: Once | ORAL | Status: AC
  Administered 2018-12-22: 50 mg via ORAL
  Filled 2018-12-22: qty 2

## 2018-12-22 NOTE — ED Provider Notes (Signed)
[facility] Emergency Department Provider Note   ____________________________________________   First MD Initiated Contact with Patient 12/22/18 [phone]     (approximate)  I have reviewed the triage vital signs and the nursing notes.   HISTORY  Chief Complaint Facial Swelling    HPI Tom Holmes is a 48 y.o. male here for evaluation of swelling and itching around his eyes scalp and hands  Patient reports that since about Friday he has had swelling around his eyelids, itchiness of the scalp and also itchiness and a bit of a rash over both forearms also on his scalp.  He relates that he thinks it is from a new body wash that he just started, he did not think about any continue to wash for the last day or so.  Reports it is very itchy, itches on his scalp, hands arms.  No pain.  No trouble breathing.  Reports he does not feel like there is any swelling in his mouth around his tongue, he is having no trouble swallowing.  He does relate that he still on his diabetes medications, but he is run out of his blood pressure medicine for about a month now and has not taken any blood pressure medicine for a month.  His prescriptions used to be carvedilol, lisinopril, and spironolactone.  Patient affirms he is not has blood pressure medicine including lisinopril for over a month   Past Medical History:  Diagnosis Date  . Cardiomyopathy (HCC)   . Diabetes mellitus without complication (HCC)   . Hypertension     Patient Active Problem List   Diagnosis Date Noted  . Hypertensive urgency 09/01/2018  . Acute on chronic systolic (congestive) heart failure [facility])     Past Surgical History:  Procedure Laterality Date  . NO PAST SURGERIES      Prior to Admission medications   Medication Sig Start Date End Date Taking? Authorizing Provider  aspirin EC 81 MG EC tablet Take 1 tablet (81 mg total) by mouth daily. 09/03/18   Alford Highland, MD  carvedilol (COREG) 3.125 MG  tablet Take 1 tablet (3.125 mg total) by mouth 2 (two) times daily for 30 days. 12/22/18 01/21/19  Sharyn Creamer, MD  furosemide (LASIX) 20 MG tablet Take 1 tablet (20 mg total) by mouth daily. 09/03/18   Alford Highland, MD  predniSONE (DELTASONE) 20 MG tablet Take 2 tablets (40 mg total) by mouth daily. 12/22/18   Sharyn Creamer, MD  spironolactone (ALDACTONE) 25 MG tablet Take 0.5 tablets (12.5 mg total) by mouth daily. 12/22/18   Sharyn Creamer, MD    Allergies Patient has no known allergies.  Family History  Problem Relation Age of Onset  . Hypertension Mother     Social History Social History   Tobacco Use  . Smoking status: Never Smoker  . Smokeless tobacco: Never Used  Substance Use Topics  . Alcohol use: Yes    Comment: socially  . Drug use: Never    Review of Systems Constitutional: No fever/chills Eyes: No visual changes except swollen around his eyelids. ENT: No sore throat. Cardiovascular: Denies chest pain. Respiratory: Denies shortness of breath. Gastrointestinal: No abdominal pain.   Genitourinary: Negative for dysuria. Musculoskeletal: Negative for back pain. Skin: Negative redness or blisters, but reports bit of hives on his forearms and scalp Neurological: Negative for headaches, areas of focal weakness or numbness.    ____________________________________________   PHYSICAL EXAM:  VITAL SIGNS: ED Triage Vitals  Enc Vitals Group  BP 12/22/18 0917 (!) 188/122     Pulse Rate 12/22/18 0917 (!) 112     Resp 12/22/18 0917 18     Temp 12/22/18 0917 98.4 F (36.9 C)     Temp Source 12/22/18 0917 Oral     SpO2 12/22/18 0917 99 %     Weight 12/22/18 0911 240 lb (108.9 kg)     Height 12/22/18 0911  (1.676 m)     Head Circumference --      Peak Flow --      Pain Score 12/22/18 0911 0     Pain Loc --      Pain Edu? --      Excl. in GC? --     Constitutional: Alert and oriented. Well appearing and in no acute distress.  He is very pleasant.  Eyes:  Conjunctivae are normal. Head: Atraumatic. Nose: No congestion/rhinnorhea. Mouth/Throat: Mucous membranes are moist.  Oropharynx is widely patent.  His tongue is normal.  Posterior oropharynx is without any edema or swelling.  There is no elevation of floor the tongue.  There is no edema surrounding the lips or face except around the periorbital regions, scalp, and then also down some over his arms where he has slight urticarial-like lesions body throughout. Neck: No stridor.  Cardiovascular: Normal rate, regular rhythm. Grossly normal heart sounds.  Good peripheral circulation. Respiratory: Normal respiratory effort.  No retractions. Lungs CTAB. Gastrointestinal: Soft and nontender. No distention. Musculoskeletal: No lower extremity tenderness nor edema. Neurologic:  Normal speech and language. No gross focal neurologic deficits are appreciated.  Skin:  Skin is warm, dry and intact. No rash noted. Psychiatric: Mood and affect are normal. Speech and behavior are normal.  ____________________________________________   LABS (all labs ordered are listed, but only abnormal results are displayed)  Labs Reviewed  GLUCOSE, CAPILLARY - Abnormal; Notable for the following components:      Result Value   Glucose-Capillary 163 (*)    All other components within normal limits  CBG MONITORING, ED   ____________________________________________  EKG   ____________________________________________  RADIOLOGY   ____________________________________________   PROCEDURES  Procedure(s) performed: None  Procedures  Critical Care performed: No  ____________________________________________   INITIAL IMPRESSION / ASSESSMENT AND PLAN / ED COURSE  Pertinent labs & imaging results that were available during my care of the patient were reviewed by me and considered in my medical decision making (see chart for details).   Patient examination appears consistent with dermatitis.  Does not appear  to be an angioedema type situation, he is not on an ACE inhibitor for over a month.  I did discuss with him and I recommend he not start use of his lisinopril, but we will happily provide prescription for his Coreg and spironolactone which she is agreeable with as he does not have access to a primary care doctor at this time.  Will treat with steroids, Benadryl, over-the-counter as an antihistamine.  Patient is agreeable, agreeable with careful return precautions.  There is no evidence of pulmonary, oropharyngeal, or concerning development of angioedema.  There is no airway involvement.  Appears localized to the skin, primarily the periorbital, scalp, forearms I suspect this likely is due to his body wash which she is discontinuing.      Tom Holmes was evaluated in Emergency Department on 12/22/2018 for the symptoms described in the history of present illness. He was evaluated in the context of the global COVID-19 pandemic, which necessitated consideration that the patient might be  at risk for infection with the SARS-CoV-2 virus that causes COVID-19. Institutional protocols and algorithms that pertain to the evaluation of patients at risk for COVID-19 are in a state of rapid change based on information released by regulatory bodies including the CDC and federal and state organizations. These policies and algorithms were followed during the patient's care in the ED.   Patient not experiencing any COVID-like symptoms  ----------------------------------------- 10:23 AM on 12/22/2018 -----------------------------------------  Some improvement in his itching, also urticaria over the arms and scalp.  Still some peri-orbital edema, but appears slightly improved.  He is resting comfortably agreeable for plan for discharge with careful return precautions.  There is consistent with contact dermatitis  Return precautions and treatment recommendations and follow-up discussed with the patient who is agreeable  with the plan.  ____________________________________________   FINAL CLINICAL IMPRESSION(S) / ED DIAGNOSES  Final diagnoses:  Allergic dermatitis        Note:  This document was prepared using Dragon voice recognition software and may include unintentional dictation errors       Sharyn CreamerQuale, Mark, MD 12/22/18 1023

## 2018-12-22 NOTE — ED Triage Notes (Signed)
Pt states swelling around his eyes that started Friday. Pt reports he is not allergic to anything that he is aware of and he does not feel like his throat is swelling or that it is hard to breath. Pt states took benadryl twice. Pt reports does not take any new medications or medications for BP. Pt states only thing new is a new body wash that he started using last week.

## 2018-12-22 NOTE — Discharge Instructions (Signed)
You have been seen in the Emergency Department (ED) today for an allergic reaction.  You have been stable throughout your stay in the Emergency Department.  Please take your medications as prescribed and follow up with your doctor as indicated.  You should also take over-the-counter Benadryl around the clock for the next three days according to the dosing instructions on the package. ° °Return to the Emergency Department (ED) if you experience any worsening or new symptoms that concern you. ° ° °

## 2020-10-14 IMAGING — CR DG CHEST 2V
2 series · 2 of 2 positions shown · non-contrast
Comparison: none

CLINICAL DATA: dizziness x 1 day. States symptoms started yesterday
evening at around 9674. Also C/O chest pain and blurry vision. Also
c/o cough x 2-3 days. Patient states he has a history of HTN and has
been off medication for about 2 months. Hx - HTN, non-smoker.

EXAM:
CHEST - 2 VIEW

[chest pa]
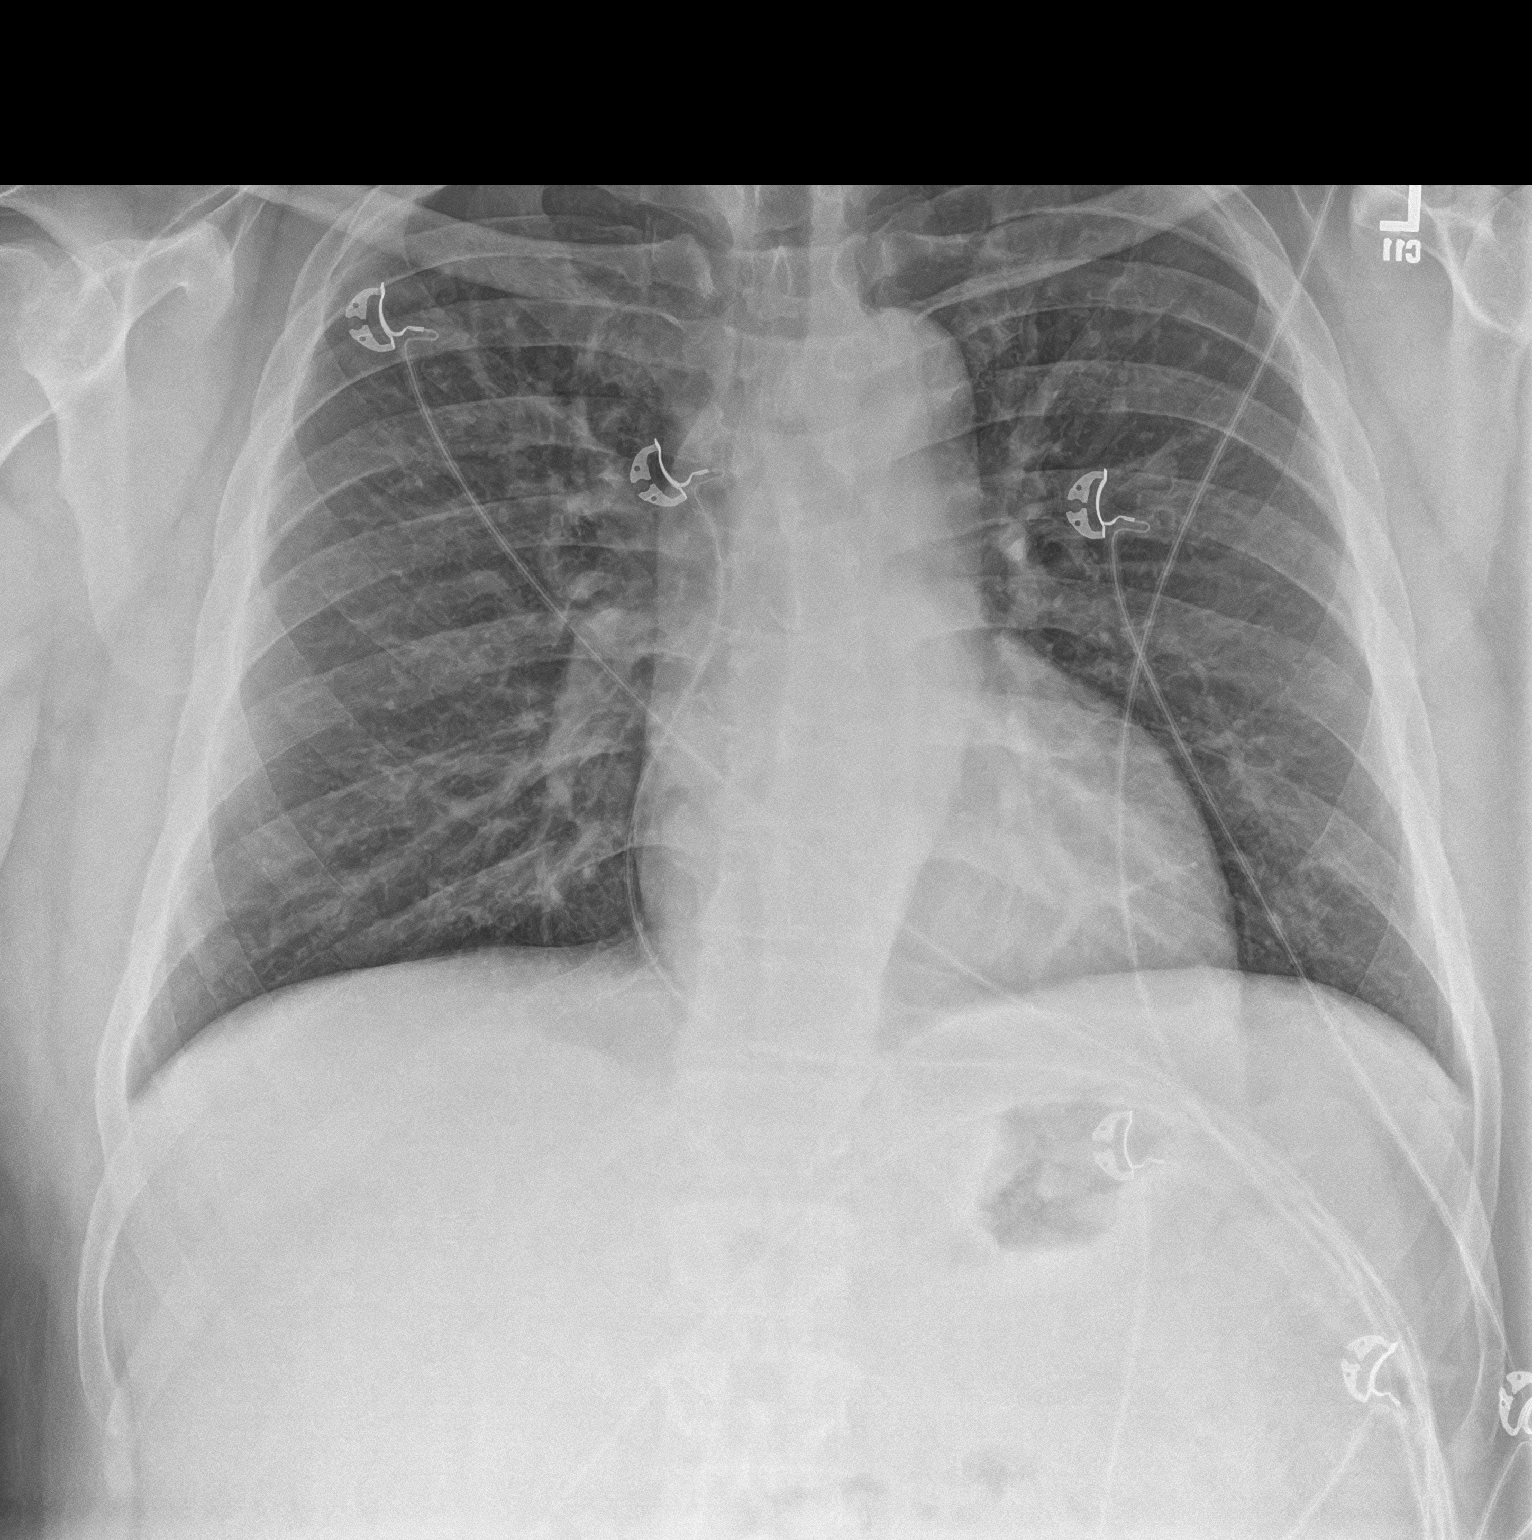

[chest lat]
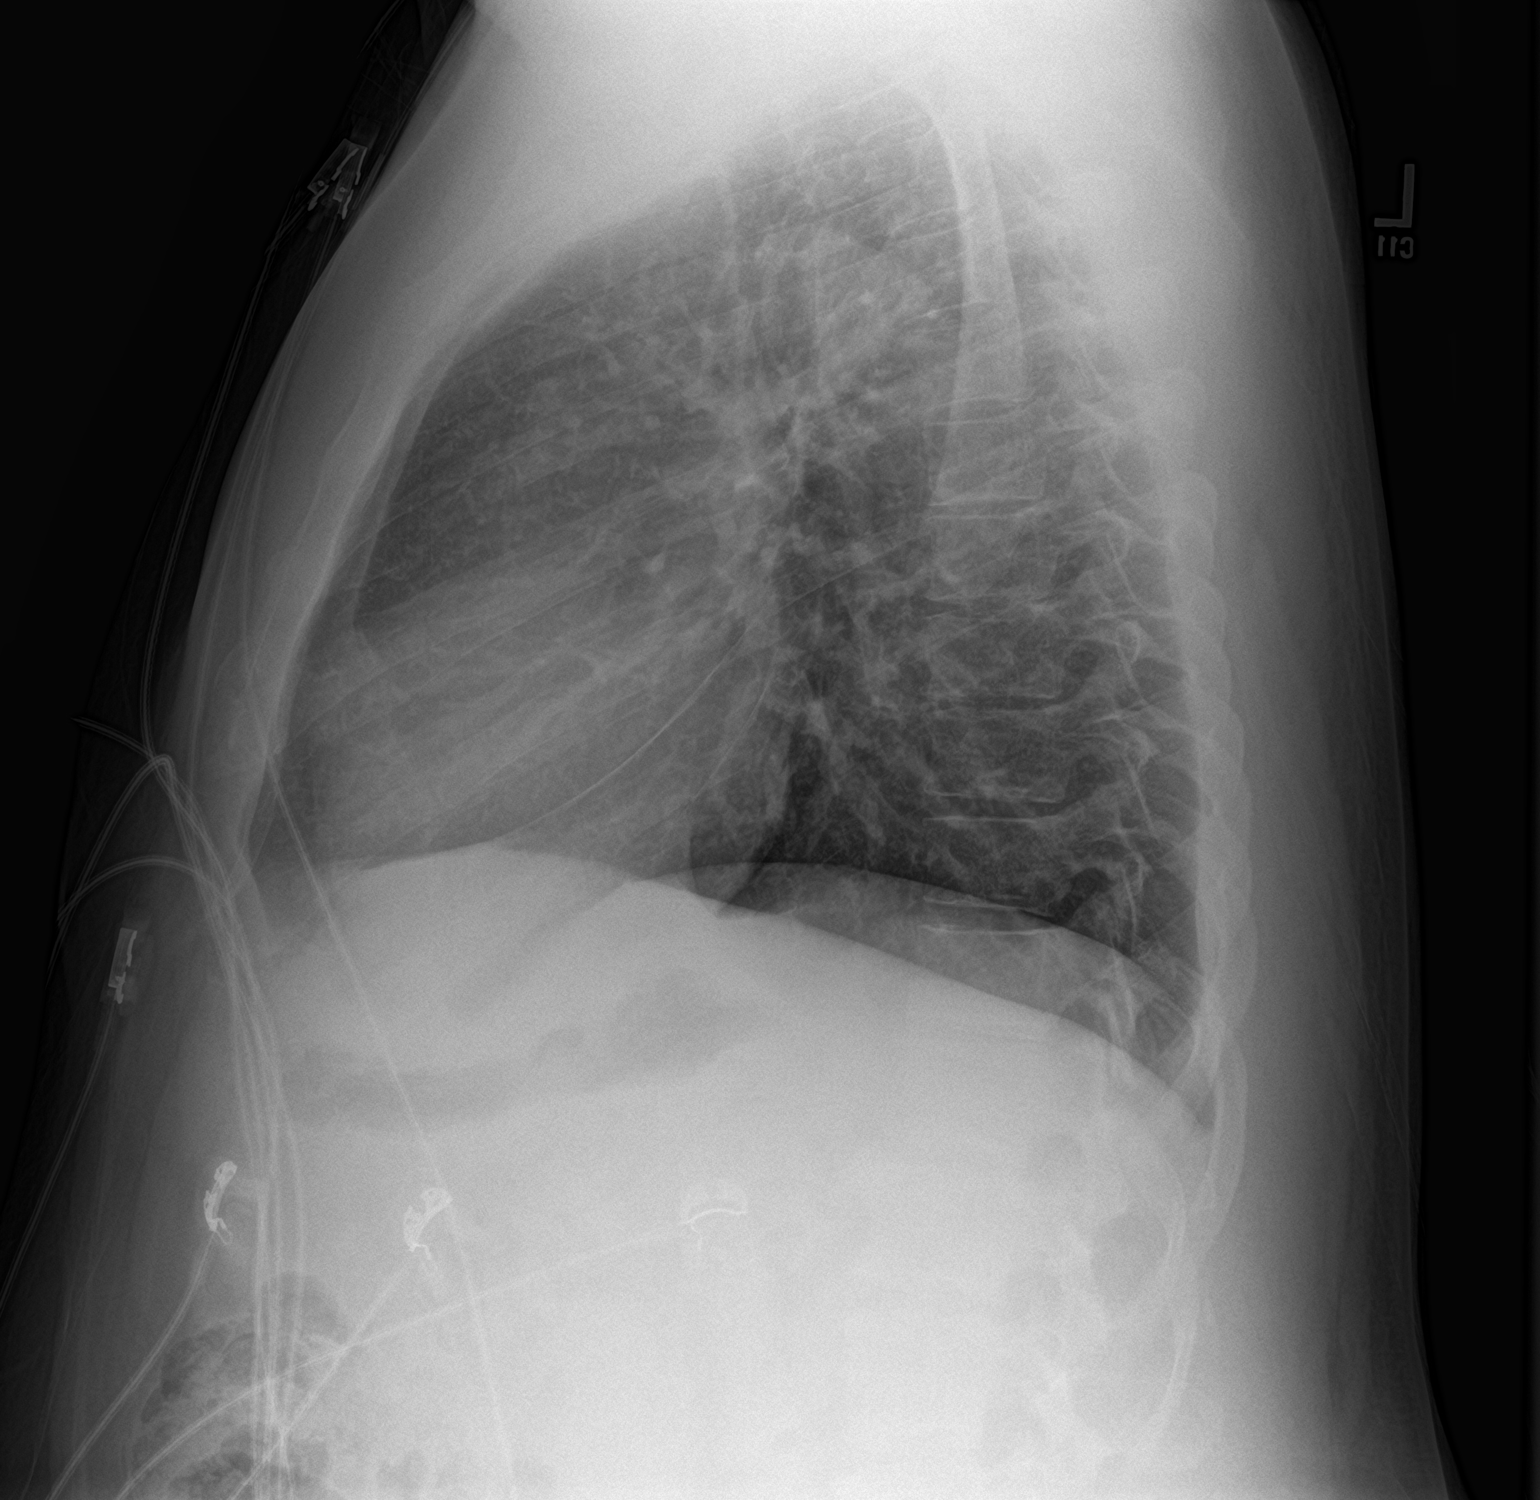

[2 of 2 positions shown; findings below may reference images not displayed]

FINDINGS: Lungs are clear.

Heart size and mediastinal contours are within normal limits.

No effusion.

Visualized bones unremarkable.
IMPRESSION: No acute cardiopulmonary disease.

## 2020-10-14 IMAGING — CT CT ANGIO CHEST-ABD-PELV FOR DISSECTION W/ AND WO/W CM
2 of 7 series · 14 of 46 positions shown, 16 images · IV contrast (APPLIED)
Comparison: Chest radiographs dated 09/01/2018

CLINICAL DATA: Hypertension, blurred vision, dizziness, possible
chest palpitations

EXAM:
CT ANGIOGRAPHY CHEST, ABDOMEN AND PELVIS
TECHNIQUE: Multidetector CT imaging through the chest, abdomen and pelvis was
performed using the standard protocol during bolus administration of
intravenous contrast. Multiplanar reconstructed images and MIPs were
obtained and reviewed to evaluate the vascular anatomy.
CONTRAST:  75mL OMNIPAQUE IOHEXOL 350 MG/ML SOLN

[Series 6: axial arterial · axial · arterial · 0.77mm/px · z∈[-699,-138]mm · 11 of 217 slices shown, 13 images]
[im 15/217  soft-tissue]
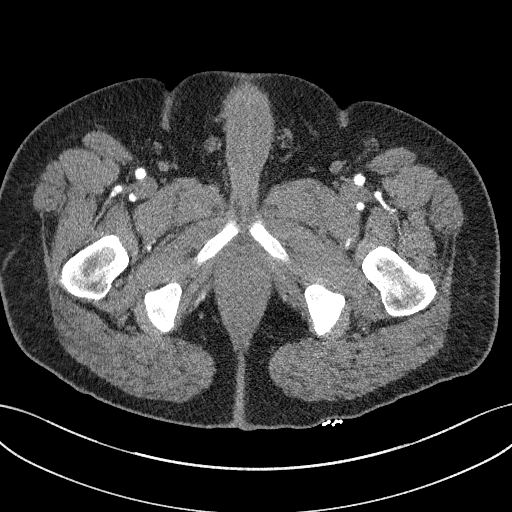
[im 15/217  bone]
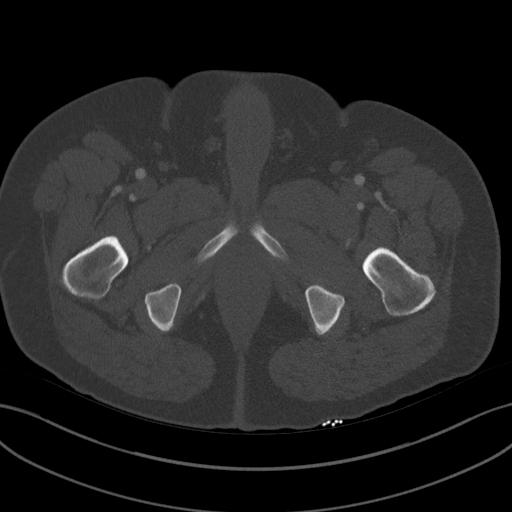
[im 29/217  soft-tissue]
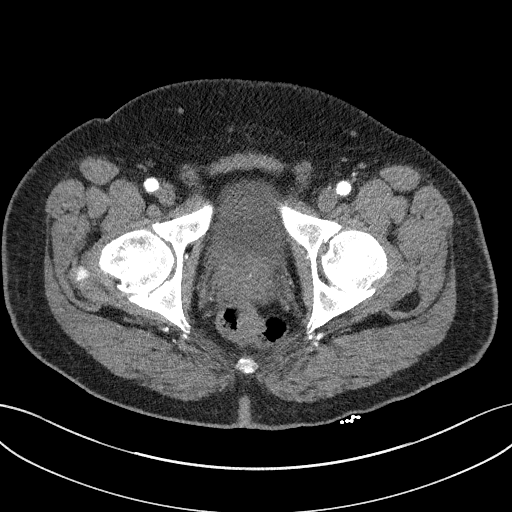
[im 58/217  soft-tissue]
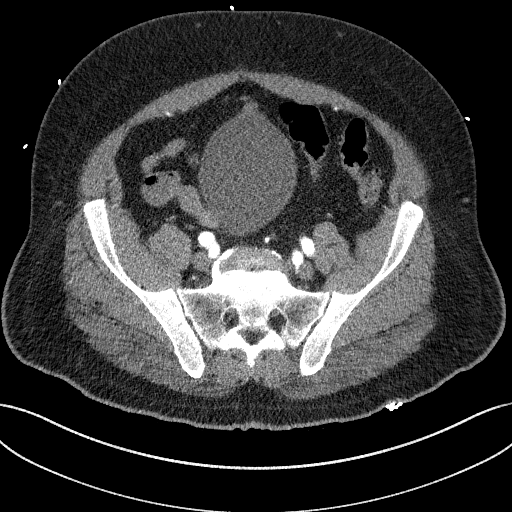
[im 73/217  soft-tissue]
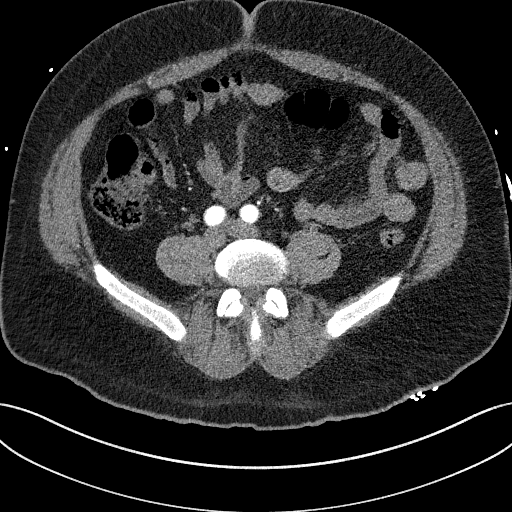
[im 87/217  soft-tissue]
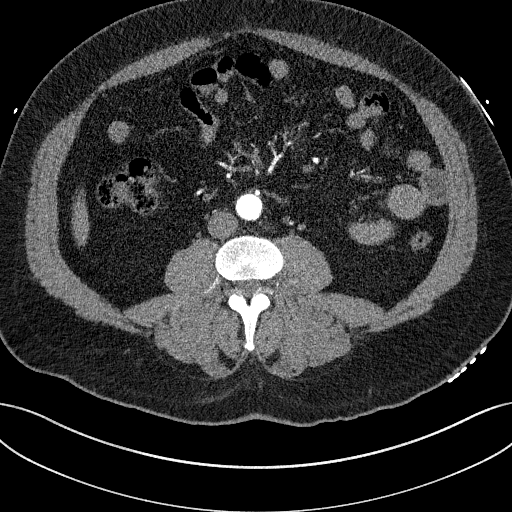
[im 116/217  soft-tissue]
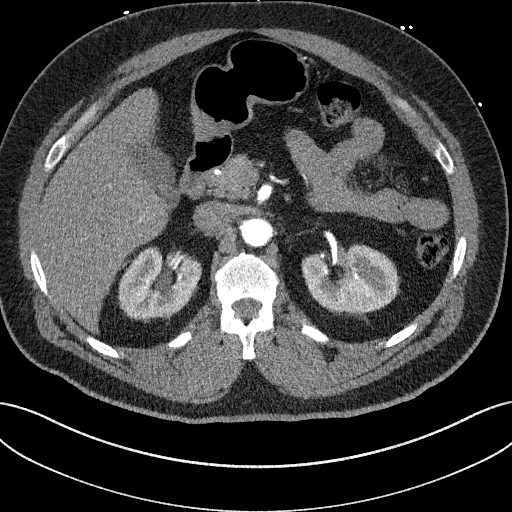
[im 130/217  soft-tissue]
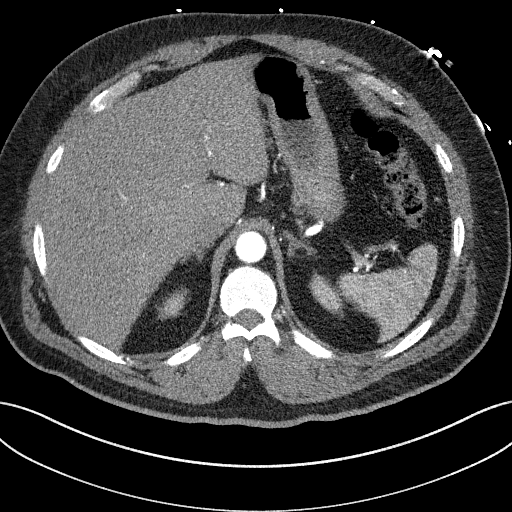
[im 145/217  soft-tissue]
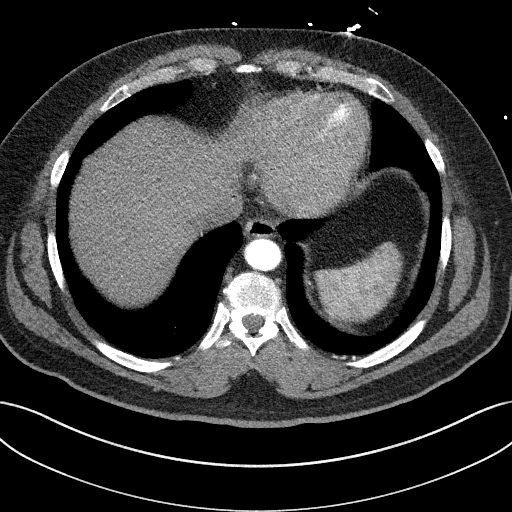
[im 159/217  soft-tissue]
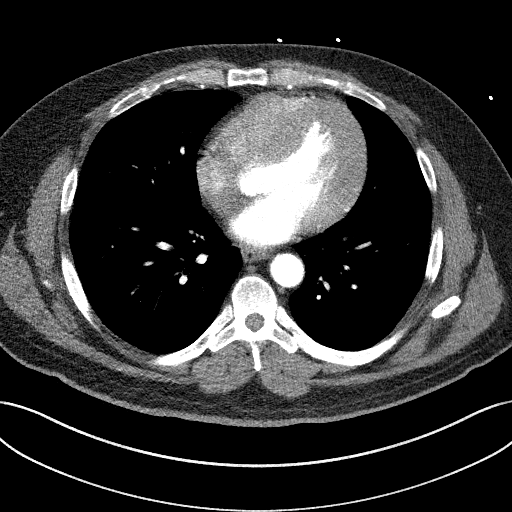
[im 159/217  bone]
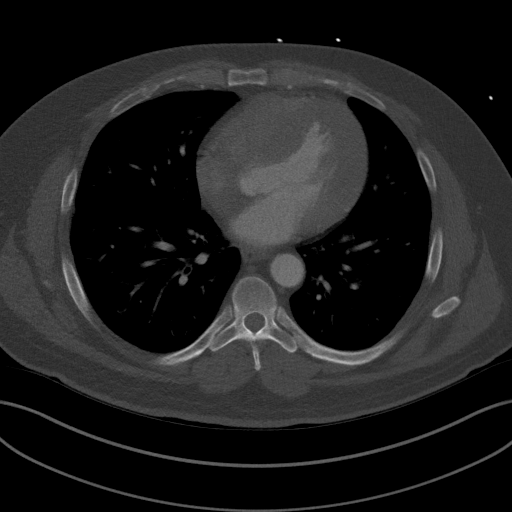
[im 188/217  soft-tissue]
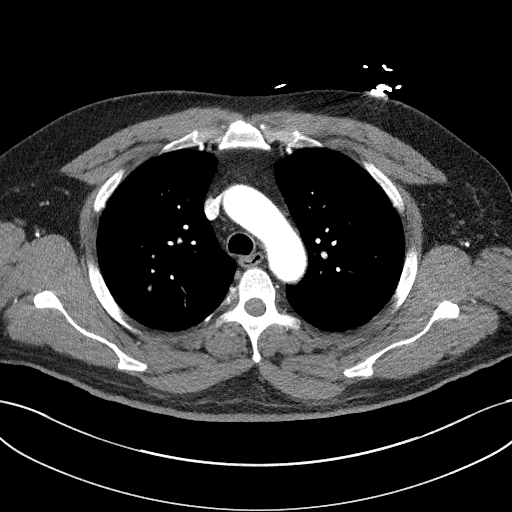
[im 202/217  soft-tissue]
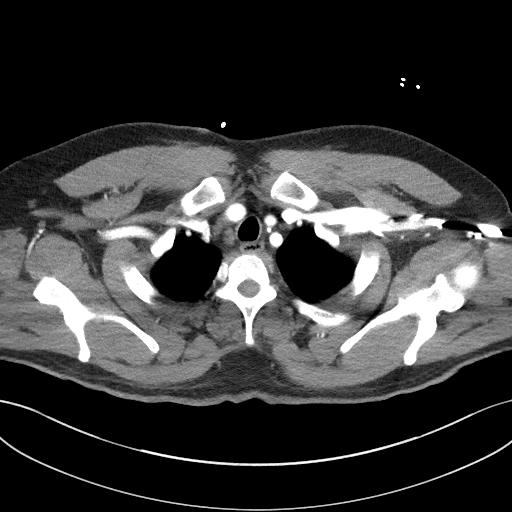

[Series 9: coronals · coronal · 0.81mm/px · 3 of 168 slices shown]
[im 42/168  soft-tissue]
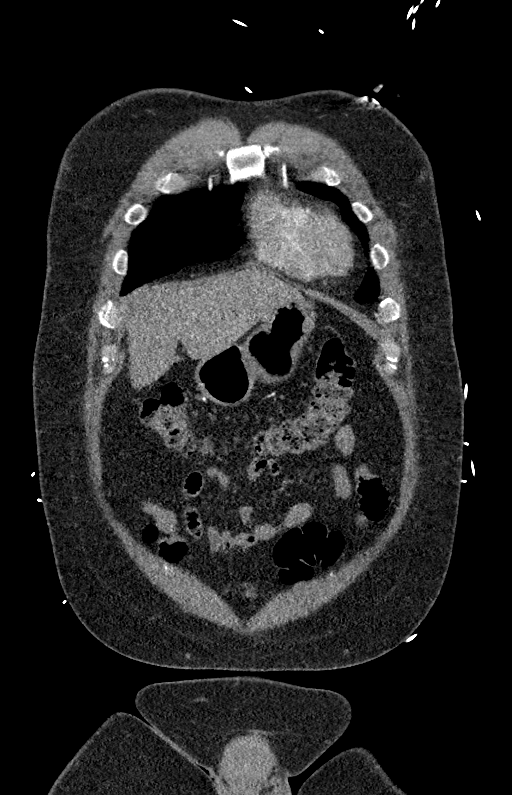
[im 84/168  soft-tissue]
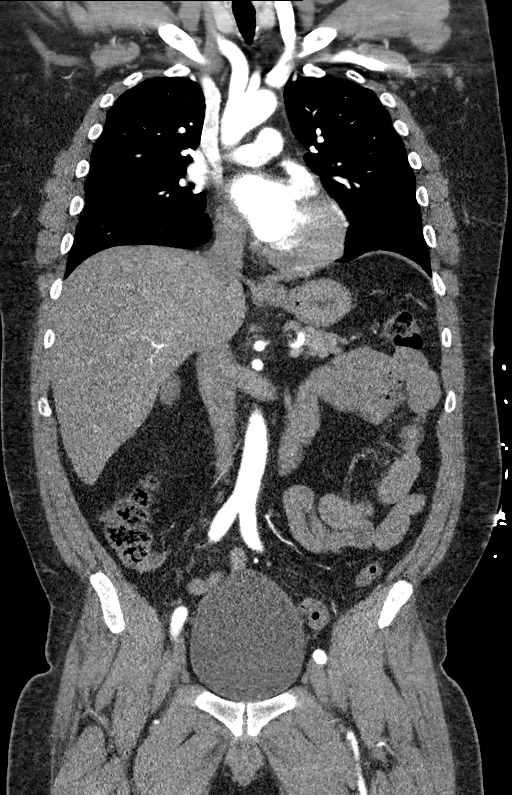
[im 126/168  soft-tissue]
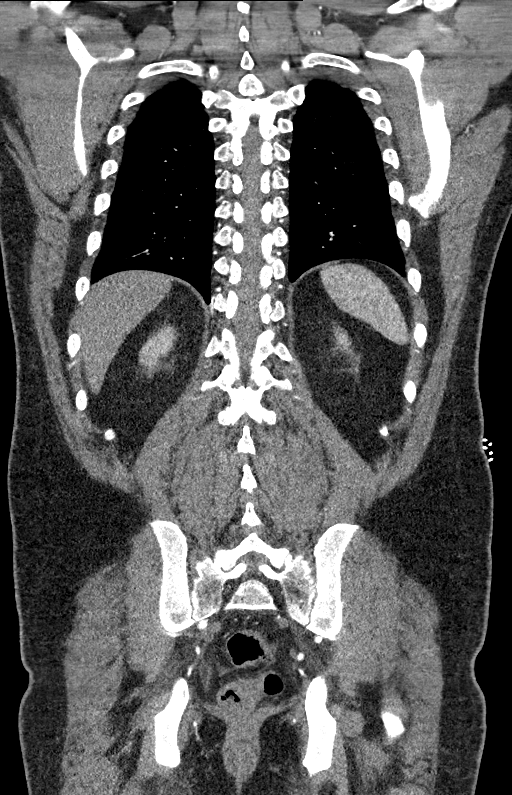

[14 of 46 positions shown; findings below may reference images not displayed]

FINDINGS: CTA CHEST FINDINGS

Cardiovascular: On unenhanced CT, there is no evidence of intramural
hematoma. No evidence of thoracic aortic aneurysm or dissection.

Although not tailored for evaluation of the pulmonary arteries,
there is no evidence of central pulmonary embolism.

The heart is normal in size.  No pericardial effusion.

Mild coronary atherosclerosis of the LAD.

Mediastinum/Nodes: No suspicious mediastinal lymphadenopathy.

Visualized thyroid is grossly unremarkable.

Lungs/Pleura: Lungs are clear.

No suspicious pulmonary nodules.

No focal consolidation.

No pleural effusion or pneumothorax.

Musculoskeletal: Visualized osseous structures are within normal
limits.

Review of the MIP images confirms the above findings.

CTA ABDOMEN AND PELVIS FINDINGS

VASCULAR

Aorta: No evidence of abdominal aortic aneurysm or dissection.
Patent.

Celiac: Patent.

SMA: Patent.

Renals: Patent bilaterally.

IMA: Patent.

Inflow: Patent.

Veins: Grossly unremarkable.

Review of the MIP images confirms the above findings.

NON-VASCULAR

Hepatobiliary: Mild hepatic steatosis.

Gallbladder is unremarkable. No intrahepatic or extrahepatic
dilatation.

Pancreas: Within normal limits.

Spleen: Within normal limits.

Adrenals/Urinary Tract: Adrenal glands are within normal limits.

Kidneys are within normal limits.  No hydronephrosis.

Bladder is within normal limits.

Stomach/Bowel: Stomach is within normal limits.

No evidence of bowel obstruction.

Normal appendix (series 6/image 150).

Lymphatic: No suspicious abdominopelvic lymphadenopathy.

Reproductive: Prostate is unremarkable.

Other: No abdominopelvic ascites.

Musculoskeletal: Mild degenerative changes of the lumbar spine, most
prominent at L5-S1.

Review of the MIP images confirms the above findings.
IMPRESSION: No evidence of thoracoabdominal aortic aneurysm or dissection.

No evidence of acute cardiopulmonary disease.

Mild hepatic steatosis.  Otherwise unremarkable CT abdomen/pelvis.
